# Patient Record
Sex: Female | Born: 1975 | Race: White | Hispanic: No | Marital: Married | Smoking: Never smoker
Health system: Southern US, Community
[De-identification: ages and names within clinical notes are randomized; demographics above are authoritative.]

## PROBLEM LIST (undated history)

## (undated) DIAGNOSIS — O24410 Gestational diabetes mellitus in pregnancy, diet controlled: Secondary | ICD-10-CM

## (undated) DIAGNOSIS — O24419 Gestational diabetes mellitus in pregnancy, unspecified control: Secondary | ICD-10-CM

## (undated) DIAGNOSIS — D62 Acute posthemorrhagic anemia: Secondary | ICD-10-CM

## (undated) DIAGNOSIS — E119 Type 2 diabetes mellitus without complications: Secondary | ICD-10-CM

## (undated) DIAGNOSIS — Z98891 History of uterine scar from previous surgery: Secondary | ICD-10-CM

## (undated) DIAGNOSIS — B999 Unspecified infectious disease: Secondary | ICD-10-CM

## (undated) HISTORY — DX: Type 2 diabetes mellitus without complications: E11.9

---

## 2002-07-21 ENCOUNTER — Other Ambulatory Visit: Admission: RE | Admit: 2002-07-21 | Discharge: 2002-07-21 | Payer: Self-pay | Admitting: Obstetrics and Gynecology

## 2004-06-30 ENCOUNTER — Encounter: Admission: RE | Admit: 2004-06-30 | Discharge: 2004-06-30 | Payer: Self-pay | Admitting: Neurology

## 2004-07-21 ENCOUNTER — Encounter (HOSPITAL_COMMUNITY): Admission: RE | Admit: 2004-07-21 | Discharge: 2004-07-21 | Payer: Self-pay | Admitting: Psychiatry

## 2005-01-09 ENCOUNTER — Encounter: Admission: RE | Admit: 2005-01-09 | Discharge: 2005-01-09 | Payer: Self-pay | Admitting: Neurology

## 2009-06-23 ENCOUNTER — Encounter: Admission: RE | Admit: 2009-06-23 | Discharge: 2009-06-23 | Payer: Self-pay | Admitting: Obstetrics & Gynecology

## 2009-07-29 ENCOUNTER — Encounter: Admission: RE | Admit: 2009-07-29 | Discharge: 2009-07-29 | Payer: Self-pay | Admitting: Obstetrics & Gynecology

## 2010-04-16 ENCOUNTER — Inpatient Hospital Stay (HOSPITAL_COMMUNITY): Admission: AD | Admit: 2010-04-16 | Discharge: 2010-04-16 | Payer: Self-pay | Admitting: Obstetrics and Gynecology

## 2010-04-19 ENCOUNTER — Inpatient Hospital Stay (HOSPITAL_COMMUNITY): Admission: AD | Admit: 2010-04-19 | Discharge: 2010-04-19 | Payer: Self-pay | Admitting: Obstetrics & Gynecology

## 2010-04-20 ENCOUNTER — Inpatient Hospital Stay (HOSPITAL_COMMUNITY): Admission: AD | Admit: 2010-04-20 | Discharge: 2010-04-23 | Payer: Self-pay | Admitting: Obstetrics & Gynecology

## 2010-04-27 ENCOUNTER — Ambulatory Visit: Admission: RE | Admit: 2010-04-27 | Discharge: 2010-04-27 | Payer: Self-pay | Admitting: Obstetrics & Gynecology

## 2010-06-29 ENCOUNTER — Inpatient Hospital Stay (HOSPITAL_COMMUNITY): Admission: AD | Admit: 2010-06-29 | Discharge: 2010-04-20 | Payer: Self-pay | Admitting: Obstetrics

## 2010-08-13 ENCOUNTER — Encounter: Payer: Self-pay | Admitting: Obstetrics & Gynecology

## 2010-10-05 LAB — CBC
HCT: 28.4 % — ABNORMAL LOW (ref 36.0–46.0)
HCT: 36.8 % (ref 36.0–46.0)
Hemoglobin: 10 g/dL — ABNORMAL LOW (ref 12.0–15.0)
Hemoglobin: 12.9 g/dL (ref 12.0–15.0)
MCH: 34.2 pg — ABNORMAL HIGH (ref 26.0–34.0)
MCH: 34.4 pg — ABNORMAL HIGH (ref 26.0–34.0)
MCHC: 35.2 g/dL (ref 30.0–36.0)
MCHC: 35.2 g/dL (ref 30.0–36.0)
MCV: 97 fL (ref 78.0–100.0)
MCV: 97.9 fL (ref 78.0–100.0)
Platelets: 198 10*3/uL (ref 150–400)
Platelets: 228 10*3/uL (ref 150–400)
RBC: 2.9 MIL/uL — ABNORMAL LOW (ref 3.87–5.11)
RBC: 3.79 MIL/uL — ABNORMAL LOW (ref 3.87–5.11)
RDW: 13.7 % (ref 11.5–15.5)
RDW: 13.8 % (ref 11.5–15.5)
WBC: 13.6 10*3/uL — ABNORMAL HIGH (ref 4.0–10.5)
WBC: 20.2 10*3/uL — ABNORMAL HIGH (ref 4.0–10.5)

## 2010-10-05 LAB — RPR: RPR Ser Ql: NONREACTIVE

## 2010-12-08 NOTE — Op Note (Signed)
NAME:  Rhonda Quinn, Rhonda Quinn NO.:  [id number]   MEDICAL RECORD NO.:  [id number]           PATIENT TYPE:   LOCATION:                               FACILITY:  [facility]   PHYSICIAN:  Marlan Palau, M.D.  DATE OF BIRTH:  October 08, 1975   DATE OF PROCEDURE:  07/21/2004  DATE OF DISCHARGE:                                 OPERATIVE REPORT   PROCEDURE PERFORMED:  Lumbar puncture.   INDICATIONS FOR PROCEDURE:  Rhonda Quinn is a 35 year old patient with a  history of paresthesias, numbness of the extremities, some weakness of the  hands, dropping things at times.  The patient is being evaluated for  possible demyelinating disease.  Scanning procedures have been unremarkable  so far.  The patient is brought in for lumbar puncture at this time.   DESCRIPTION OF PROCEDURE:  Lumbar puncture was performed with the patient in  fetal position on the right side.  The low back was cleaned with Betadine  solution and approximately 2 mL of 1% Xylocaine was used as local  anesthetic.  A 20 gauge spinal needle was inserted in the L3-4 interspace  and approximately 18 mL of clear colorless spinal fluid was removed for  testing.  Opening pressure was 160 mmH2O.  Tube #1 was sent for VDRL,  cryptococcal antigen, angiotensin converting enzyme level.  Tube #2 was sent  for oligoclonal banding and IgG albumin ratio.  Tube #3 was sent for cell  differential, glucose, protein.  Tube #4 was sent for Lyme's antibody panel.  Blood work was sent for ANA, rheumatoid factor, sed rate, antiphospholipid  antibody panel, lupus anticoagulant antibody, factor V Leiden.  The patient  tolerated the procedure well.  No complications of the above procedure were  noted.        ___________________________________________  C. Lesia Sago, M.D.    CKW/MEDQ  D:  07/21/2004  T:  07/21/2004  Job:  [id number]

## 2012-03-28 LAB — OB RESULTS CONSOLE HEPATITIS B SURFACE ANTIGEN: Hepatitis B Surface Ag: NEGATIVE

## 2012-03-28 LAB — OB RESULTS CONSOLE ANTIBODY SCREEN: Antibody Screen: NEGATIVE

## 2012-03-28 LAB — OB RESULTS CONSOLE RPR: RPR: NONREACTIVE

## 2012-03-28 LAB — OB RESULTS CONSOLE ABO/RH: RH Type: POSITIVE

## 2012-04-07 LAB — OB RESULTS CONSOLE GC/CHLAMYDIA: Chlamydia: NEGATIVE

## 2012-08-15 ENCOUNTER — Encounter: Payer: BC Managed Care – PPO | Attending: Obstetrics & Gynecology | Admitting: *Deleted

## 2012-08-15 ENCOUNTER — Encounter: Payer: Self-pay | Admitting: *Deleted

## 2012-08-15 VITALS — Ht 65.0 in | Wt 146.3 lb

## 2012-08-15 DIAGNOSIS — O24419 Gestational diabetes mellitus in pregnancy, unspecified control: Secondary | ICD-10-CM

## 2012-08-15 DIAGNOSIS — O99810 Abnormal glucose complicating pregnancy: Secondary | ICD-10-CM | POA: Insufficient documentation

## 2012-08-15 DIAGNOSIS — Z713 Dietary counseling and surveillance: Secondary | ICD-10-CM | POA: Insufficient documentation

## 2012-08-15 NOTE — Progress Notes (Signed)
  Patient was seen on 08/15/12 for Gestational Diabetes self-management class at the [facility]. The following learning objectives were met by the patient during this course:   States the definition of Gestational Diabetes  States why dietary management is important in controlling blood glucose  Describes the effects each nutrient has on blood glucose levels  Demonstrates ability to create a balanced meal plan  Demonstrates carbohydrate counting   States when to check blood glucose levels  Demonstrates proper blood glucose monitoring techniques  States the effect of stress and exercise on blood glucose levels  States the importance of limiting caffeine and abstaining from alcohol and smoking  Blood glucose monitor given:  One Touch Ultra Mini Self Monitoring Kit Lot # [id number] X Exp: 4/15 Blood glucose reading: 62 mg md/dl  Patient instructed to monitor glucose levels: FBS: 60 - <90 2 hour: <120  *Patient received handouts:  Nutrition Diabetes and Pregnancy  Carbohydrate Counting List  Patient will be seen for follow-up as needed.

## 2012-08-15 NOTE — Patient Instructions (Signed)
Goals:  Check glucose levels per MD as instructed  Follow Gestational Diabetes Diet as instructed  Call for follow-up as needed    

## 2012-09-24 LAB — OB RESULTS CONSOLE GBS: GBS: NEGATIVE

## 2012-10-27 ENCOUNTER — Encounter (HOSPITAL_COMMUNITY): Payer: Self-pay | Admitting: *Deleted

## 2012-10-27 ENCOUNTER — Encounter (HOSPITAL_COMMUNITY)
Admission: AD | Disposition: A | Discharge status: Home / Self Care | Payer: Self-pay | Source: Ambulatory Visit | Attending: Obstetrics & Gynecology

## 2012-10-27 ENCOUNTER — Inpatient Hospital Stay (HOSPITAL_COMMUNITY): Payer: BC Managed Care – PPO | Admitting: Anesthesiology

## 2012-10-27 ENCOUNTER — Encounter (HOSPITAL_COMMUNITY): Payer: Self-pay | Admitting: Anesthesiology

## 2012-10-27 ENCOUNTER — Inpatient Hospital Stay (HOSPITAL_COMMUNITY): Admission: RE | Admit: 2012-10-27 | Payer: BC Managed Care – PPO | Source: Ambulatory Visit

## 2012-10-27 ENCOUNTER — Inpatient Hospital Stay (HOSPITAL_COMMUNITY)
Admission: AD | Admit: 2012-10-27 | Discharge: 2012-10-30 | DRG: 370 | Disposition: A | Discharge status: Home / Self Care | Payer: BC Managed Care – PPO | Source: Ambulatory Visit | Attending: Obstetrics & Gynecology | Admitting: Obstetrics & Gynecology

## 2012-10-27 DIAGNOSIS — O34219 Maternal care for unspecified type scar from previous cesarean delivery: Secondary | ICD-10-CM | POA: Diagnosis present

## 2012-10-27 DIAGNOSIS — D62 Acute posthemorrhagic anemia: Secondary | ICD-10-CM

## 2012-10-27 DIAGNOSIS — Z98891 History of uterine scar from previous surgery: Secondary | ICD-10-CM

## 2012-10-27 DIAGNOSIS — O324XX0 Maternal care for high head at term, not applicable or unspecified: Secondary | ICD-10-CM | POA: Diagnosis present

## 2012-10-27 DIAGNOSIS — O24410 Gestational diabetes mellitus in pregnancy, diet controlled: Secondary | ICD-10-CM

## 2012-10-27 DIAGNOSIS — O09529 Supervision of elderly multigravida, unspecified trimester: Secondary | ICD-10-CM | POA: Diagnosis present

## 2012-10-27 DIAGNOSIS — O99814 Abnormal glucose complicating childbirth: Secondary | ICD-10-CM | POA: Diagnosis present

## 2012-10-27 DIAGNOSIS — O9902 Anemia complicating childbirth: Secondary | ICD-10-CM | POA: Diagnosis present

## 2012-10-27 DIAGNOSIS — D500 Iron deficiency anemia secondary to blood loss (chronic): Secondary | ICD-10-CM | POA: Diagnosis present

## 2012-10-27 HISTORY — DX: Gestational diabetes mellitus in pregnancy, diet controlled: O24.410

## 2012-10-27 HISTORY — DX: History of uterine scar from previous surgery: Z98.891

## 2012-10-27 HISTORY — DX: Acute posthemorrhagic anemia: D62

## 2012-10-27 HISTORY — DX: Unspecified infectious disease: B99.9

## 2012-10-27 HISTORY — DX: Gestational diabetes mellitus in pregnancy, unspecified control: O24.419

## 2012-10-27 LAB — CBC
Hemoglobin: 13.3 g/dL (ref 12.0–15.0)
MCH: 31.4 pg (ref 26.0–34.0)
Platelets: 242 10*3/uL (ref 150–400)
RBC: 4.23 MIL/uL (ref 3.87–5.11)

## 2012-10-27 LAB — GLUCOSE, CAPILLARY: Glucose-Capillary: 118 mg/dL — ABNORMAL HIGH (ref 70–99)

## 2012-10-27 LAB — TYPE AND SCREEN
ABO/RH(D): A POS
Antibody Screen: NEGATIVE

## 2012-10-27 LAB — ABO/RH: ABO/RH(D): A POS

## 2012-10-27 SURGERY — Surgical Case
Anesthesia: Epidural | Site: Abdomen | Wound class: Clean Contaminated

## 2012-10-27 MED ORDER — PHENYLEPHRINE 40 MCG/ML (10ML) SYRINGE FOR IV PUSH (FOR BLOOD PRESSURE SUPPORT)
80.0000 ug | PREFILLED_SYRINGE | INTRAVENOUS | Status: DC | PRN
  Filled 2012-10-27: qty 5
  Filled 2012-10-27: qty 2

## 2012-10-27 MED ORDER — CITRIC ACID-SODIUM CITRATE 334-500 MG/5ML PO SOLN
30.0000 mL | ORAL | Status: DC | PRN
  Administered 2012-10-27: 30 mL via ORAL
  Filled 2012-10-27: qty 15

## 2012-10-27 MED ORDER — OXYTOCIN BOLUS FROM INFUSION: 500.0000 mL | INTRAVENOUS | Status: DC

## 2012-10-27 MED ORDER — DIPHENHYDRAMINE HCL 50 MG/ML IJ SOLN: 12.5000 mg | INTRAMUSCULAR | Status: DC | PRN

## 2012-10-27 MED ORDER — LACTATED RINGERS IV SOLN
500.0000 mL | INTRAVENOUS | Status: DC | PRN
  Administered 2012-10-27: 22:00:00 via INTRAVENOUS

## 2012-10-27 MED ORDER — FENTANYL 2.5 MCG/ML BUPIVACAINE 1/10 % EPIDURAL INFUSION (WH - ANES)
14.0000 mL/h | INTRAMUSCULAR | Status: DC | PRN
  Filled 2012-10-27: qty 125

## 2012-10-27 MED ORDER — MORPHINE SULFATE 0.5 MG/ML IJ SOLN
INTRAMUSCULAR | Status: AC
  Filled 2012-10-27: qty 10

## 2012-10-27 MED ORDER — CHLOROPROCAINE HCL 3 % IJ SOLN
INTRAMUSCULAR | Status: AC
  Filled 2012-10-27: qty 20

## 2012-10-27 MED ORDER — PHENYLEPHRINE 40 MCG/ML (10ML) SYRINGE FOR IV PUSH (FOR BLOOD PRESSURE SUPPORT)
PREFILLED_SYRINGE | INTRAVENOUS | Status: AC
  Filled 2012-10-27: qty 5

## 2012-10-27 MED ORDER — ACETAMINOPHEN 325 MG PO TABS: 650.0000 mg | ORAL_TABLET | ORAL | Status: DC | PRN

## 2012-10-27 MED ORDER — KETOROLAC TROMETHAMINE 60 MG/2ML IM SOLN: 60.0000 mg | Freq: Once | INTRAMUSCULAR | Status: AC | PRN

## 2012-10-27 MED ORDER — FENTANYL CITRATE 0.05 MG/ML IJ SOLN
INTRAMUSCULAR | Status: AC
  Filled 2012-10-27: qty 2

## 2012-10-27 MED ORDER — OXYTOCIN 10 UNIT/ML IJ SOLN
40.0000 [IU] | INTRAVENOUS | Status: DC | PRN
  Administered 2012-10-27: 40 [IU] via INTRAVENOUS

## 2012-10-27 MED ORDER — OXYTOCIN 40 UNITS IN LACTATED RINGERS INFUSION - SIMPLE MED
62.5000 mL/h | INTRAVENOUS | Status: DC
  Filled 2012-10-27: qty 1000

## 2012-10-27 MED ORDER — BUPIVACAINE HCL 0.25 % IJ SOLN
INTRAMUSCULAR | Status: DC | PRN
  Administered 2012-10-27: 10 mL

## 2012-10-27 MED ORDER — LACTATED RINGERS IV SOLN
500.0000 mL | Freq: Once | INTRAVENOUS | Status: AC
  Administered 2012-10-27: 1000 mL via INTRAVENOUS

## 2012-10-27 MED ORDER — IBUPROFEN 600 MG PO TABS: 600.0000 mg | ORAL_TABLET | Freq: Four times a day (QID) | ORAL | Status: DC | PRN

## 2012-10-27 MED ORDER — SCOPOLAMINE 1 MG/3DAYS TD PT72
MEDICATED_PATCH | TRANSDERMAL | Status: AC
  Administered 2012-10-27: 1.5 mg via TRANSDERMAL
  Filled 2012-10-27: qty 1

## 2012-10-27 MED ORDER — CEFAZOLIN SODIUM-DEXTROSE 2-3 GM-% IV SOLR
INTRAVENOUS | Status: DC | PRN
  Administered 2012-10-27: 2 g via INTRAVENOUS

## 2012-10-27 MED ORDER — SCOPOLAMINE 1 MG/3DAYS TD PT72: 1.0000 | MEDICATED_PATCH | Freq: Once | TRANSDERMAL | Status: DC

## 2012-10-27 MED ORDER — MORPHINE SULFATE (PF) 0.5 MG/ML IJ SOLN
INTRAMUSCULAR | Status: DC | PRN
  Administered 2012-10-27: 4 mg via EPIDURAL

## 2012-10-27 MED ORDER — LACTATED RINGERS IV SOLN
INTRAVENOUS | Status: DC
  Administered 2012-10-27 (×5): via INTRAVENOUS

## 2012-10-27 MED ORDER — FENTANYL CITRATE 0.05 MG/ML IJ SOLN
INTRAMUSCULAR | Status: DC | PRN
  Administered 2012-10-27 (×2): 50 ug via INTRAVENOUS
  Administered 2012-10-27: 100 ug via EPIDURAL

## 2012-10-27 MED ORDER — BUPIVACAINE HCL (PF) 0.25 % IJ SOLN
INTRAMUSCULAR | Status: AC
  Filled 2012-10-27: qty 30

## 2012-10-27 MED ORDER — CEFAZOLIN SODIUM-DEXTROSE 2-3 GM-% IV SOLR
INTRAVENOUS | Status: AC
  Filled 2012-10-27: qty 50

## 2012-10-27 MED ORDER — MEPERIDINE HCL 25 MG/ML IJ SOLN: 6.2500 mg | INTRAMUSCULAR | Status: DC | PRN

## 2012-10-27 MED ORDER — SODIUM BICARBONATE 8.4 % IV SOLN
INTRAVENOUS | Status: DC | PRN
  Administered 2012-10-27: 5 mL via EPIDURAL

## 2012-10-27 MED ORDER — FENTANYL CITRATE 0.05 MG/ML IJ SOLN: 25.0000 ug | INTRAMUSCULAR | Status: DC | PRN

## 2012-10-27 MED ORDER — EPHEDRINE 5 MG/ML INJ
10.0000 mg | INTRAVENOUS | Status: DC | PRN
  Administered 2012-10-27: 10 mg via INTRAVENOUS
  Filled 2012-10-27: qty 2

## 2012-10-27 MED ORDER — ONDANSETRON HCL 4 MG/2ML IJ SOLN: 4.0000 mg | Freq: Four times a day (QID) | INTRAMUSCULAR | Status: DC | PRN

## 2012-10-27 MED ORDER — EPHEDRINE 5 MG/ML INJ
INTRAVENOUS | Status: AC
  Filled 2012-10-27: qty 10

## 2012-10-27 MED ORDER — LIDOCAINE HCL (PF) 1 % IJ SOLN
INTRAMUSCULAR | Status: DC | PRN
  Administered 2012-10-27 (×2): 8 mL

## 2012-10-27 MED ORDER — OXYCODONE-ACETAMINOPHEN 5-325 MG PO TABS: 1.0000 | ORAL_TABLET | ORAL | Status: DC | PRN

## 2012-10-27 MED ORDER — SODIUM BICARBONATE 8.4 % IV SOLN
INTRAVENOUS | Status: DC | PRN
  Administered 2012-10-27: 20 mL via EPIDURAL

## 2012-10-27 MED ORDER — PHENYLEPHRINE 40 MCG/ML (10ML) SYRINGE FOR IV PUSH (FOR BLOOD PRESSURE SUPPORT)
80.0000 ug | PREFILLED_SYRINGE | INTRAVENOUS | Status: AC | PRN
  Administered 2012-10-27 (×7): 40 ug via INTRAVENOUS
  Administered 2012-10-27 (×2): 80 ug via INTRAVENOUS
  Administered 2012-10-27: 40 ug via INTRAVENOUS
  Administered 2012-10-27: 80 ug via INTRAVENOUS
  Administered 2012-10-27: 40 ug via INTRAVENOUS
  Filled 2012-10-27: qty 2

## 2012-10-27 MED ORDER — LIDOCAINE HCL (PF) 1 % IJ SOLN
30.0000 mL | INTRAMUSCULAR | Status: DC | PRN
  Filled 2012-10-27: qty 30

## 2012-10-27 MED ORDER — ONDANSETRON HCL 4 MG/2ML IJ SOLN
INTRAMUSCULAR | Status: DC | PRN
  Administered 2012-10-27: 4 mg via INTRAVENOUS

## 2012-10-27 MED ORDER — EPHEDRINE 5 MG/ML INJ
10.0000 mg | INTRAVENOUS | Status: DC | PRN
  Filled 2012-10-27: qty 2
  Filled 2012-10-27: qty 4

## 2012-10-27 MED ORDER — MORPHINE SULFATE (PF) 0.5 MG/ML IJ SOLN
INTRAMUSCULAR | Status: DC | PRN
  Administered 2012-10-27: 1 mg via INTRAVENOUS

## 2012-10-27 MED ORDER — FENTANYL 2.5 MCG/ML BUPIVACAINE 1/10 % EPIDURAL INFUSION (WH - ANES)
INTRAMUSCULAR | Status: DC | PRN
  Administered 2012-10-27: 14 mL/h via EPIDURAL

## 2012-10-27 MED ORDER — KETOROLAC TROMETHAMINE 60 MG/2ML IM SOLN
INTRAMUSCULAR | Status: AC
  Administered 2012-10-27: 60 mg via INTRAMUSCULAR
  Filled 2012-10-27: qty 2

## 2012-10-27 SURGICAL SUPPLY — 40 items
BENZOIN TINCTURE PRP APPL 2/3 (GAUZE/BANDAGES/DRESSINGS) ×2 IMPLANT
CLOTH BEACON ORANGE TIMEOUT ST (SAFETY) ×2 IMPLANT
CONTAINER PREFILL 10% NBF 15ML (MISCELLANEOUS) IMPLANT
DRAPE LG THREE QUARTER DISP (DRAPES) ×2 IMPLANT
DRSG OPSITE POSTOP 4X10 (GAUZE/BANDAGES/DRESSINGS) ×2 IMPLANT
DURAPREP 26ML APPLICATOR (WOUND CARE) ×2 IMPLANT
ELECT REM PT RETURN 9FT ADLT (ELECTROSURGICAL) ×2
ELECTRODE REM PT RTRN 9FT ADLT (ELECTROSURGICAL) ×1 IMPLANT
EXTRACTOR VACUUM KIWI (MISCELLANEOUS) IMPLANT
EXTRACTOR VACUUM M CUP 4 TUBE (SUCTIONS) IMPLANT
GLOVE BIO SURGEON STRL SZ7 (GLOVE) ×2 IMPLANT
GLOVE BIOGEL PI IND STRL 7.0 (GLOVE) ×1 IMPLANT
GLOVE BIOGEL PI INDICATOR 7.0 (GLOVE) ×1
GOWN STRL REIN XL XLG (GOWN DISPOSABLE) ×4 IMPLANT
KIT ABG SYR 3ML LUER SLIP (SYRINGE) IMPLANT
NEEDLE HYPO 25X5/8 SAFETYGLIDE (NEEDLE) IMPLANT
NS IRRIG 1000ML POUR BTL (IV SOLUTION) ×2 IMPLANT
PACK C SECTION WH (CUSTOM PROCEDURE TRAY) ×2 IMPLANT
PAD ABD 7.5X8 STRL (GAUZE/BANDAGES/DRESSINGS) ×2 IMPLANT
PAD OB MATERNITY 4.3X12.25 (PERSONAL CARE ITEMS) ×2 IMPLANT
RTRCTR C-SECT PINK 25CM LRG (MISCELLANEOUS) IMPLANT
SLEEVE SCD COMPRESS KNEE MED (MISCELLANEOUS) ×2 IMPLANT
STAPLER VISISTAT 35W (STAPLE) IMPLANT
STRIP CLOSURE SKIN 1/4X4 (GAUZE/BANDAGES/DRESSINGS) ×2 IMPLANT
SUT MNCRL 0 VIOLET CTX 36 (SUTURE) ×3 IMPLANT
SUT MON AB 2-0 SH 27 (SUTURE) ×1
SUT MON AB 2-0 SH27 (SUTURE) ×1 IMPLANT
SUT MONOCRYL 0 CTX 36 (SUTURE) ×3
SUT PLAIN 0 NONE (SUTURE) IMPLANT
SUT PLAIN 2 0 (SUTURE)
SUT PLAIN ABS 2-0 CT1 27XMFL (SUTURE) IMPLANT
SUT VIC AB 0 CT1 27 (SUTURE) ×2
SUT VIC AB 0 CT1 27XBRD ANBCTR (SUTURE) ×2 IMPLANT
SUT VIC AB 2-0 CT1 27 (SUTURE) ×2
SUT VIC AB 2-0 CT1 TAPERPNT 27 (SUTURE) ×2 IMPLANT
SUT VIC AB 4-0 KS 27 (SUTURE) ×2 IMPLANT
SUT VICRYL 0 TIES 12 18 (SUTURE) IMPLANT
TOWEL OR 17X24 6PK STRL BLUE (TOWEL DISPOSABLE) ×6 IMPLANT
TRAY FOLEY CATH 14FR (SET/KITS/TRAYS/PACK) IMPLANT
WATER STERILE IRR 1000ML POUR (IV SOLUTION) IMPLANT

## 2012-10-27 NOTE — MAU Note (Addendum)
Contractions getting closer and stronger, started at 0100.  No bleeding or leaking, has been having mucous d/c. Was 2 in office this morning.  2nd baby, gest diab- diet controlled.

## 2012-10-27 NOTE — Anesthesia Preprocedure Evaluation (Addendum)
Anesthesia Evaluation  Patient identified by MRN, date of birth, ID band Patient awake    Reviewed: Allergy & Precautions, H&P , NPO status , Patient's Chart, lab work & pertinent test results  Airway Mallampati: I TM Distance: >3 FB Neck ROM: full    Dental no notable dental hx.    Pulmonary neg pulmonary ROS,    Pulmonary exam normal       Cardiovascular negative cardio ROS      Neuro/Psych negative neurological ROS  negative psych ROS   GI/Hepatic negative GI ROS, Neg liver ROS,   Endo/Other    Renal/GU negative Renal ROS  negative genitourinary   Musculoskeletal negative musculoskeletal ROS (+)   Abdominal Normal abdominal exam  (+)   Peds negative pediatric ROS (+)  Hematology negative hematology ROS (+)   Anesthesia Other Findings   Reproductive/Obstetrics (+) Pregnancy (failed TOLAC -- for C/S)                          Anesthesia Physical  Anesthesia Plan  ASA: II  Anesthesia Plan: Epidural   Post-op Pain Management:    Induction:   Airway Management Planned:   Additional Equipment:   Intra-op Plan:   Post-operative Plan:   Informed Consent: I have reviewed the patients History and Physical, chart, labs and discussed the procedure including the risks, benefits and alternatives for the proposed anesthesia with the patient or authorized representative who has indicated his/her understanding and acceptance.     Plan Discussed with:   Anesthesia Plan Comments:         Anesthesia Quick Evaluation

## 2012-10-27 NOTE — Progress Notes (Signed)
RAMIA SIDNEY is a 37 y.o. G2P1001 at [redacted]w[redacted]d, trying TOLAC. Feels a lot of rectal pressure. UCs somewhat better since epidural  Objective: BP 105/56  Pulse 91  Temp(Src) 99.8 F (37.7 C) (Oral)  Resp 18  Ht 5\' 5"  (1.651 m)  Wt 150 lb (68.04 kg)  BMI 24.96 kg/m2  SpO2 91%   FHT:  FHR: 140s bpm, variability: moderate,  accelerations:  Present,  decelerations:  Absent UC:  Spontaneous contractions, coupling noted, UCs q 2-3 minutes, some every minute, good restign tone/ relaxed uterus in b/w contractions. No suprapubic tenderness  SVE:   Dilation: 10 Effacement (%): 100 Station: +1 Exam by:: Dr Juliene Pina Complete and same station 0 to +1, LOT since about 3 hrs now. Tried manual rotation, exaggerated Sims.   Labs: Lab Results  Component Value Date   WBC 15.7* 10/27/2012   HGB 13.3 10/27/2012   HCT 38.1 10/27/2012   MCV 90.1 10/27/2012   PLT 242 10/27/2012    Assessment / Plan: Arrest of decent  Labor: Progressing normally and but now arrest of descent and rotation. Fetal Wellbeing:  Category I Pain Control:  Epidural Anticipated MOD:  Guarded for vaginal birth, reassess.   Alysen Smylie R 10/27/2012, 8:41 PM

## 2012-10-27 NOTE — Anesthesia Procedure Notes (Signed)
Epidural Patient location during procedure: OB Start time: 10/27/2012 6:16 PM End time: 10/27/2012 6:20 PM  Staffing Anesthesiologist: Sandrea Hughs Performed by: anesthesiologist   Preanesthetic Checklist Completed: patient identified, site marked, surgical consent, pre-op evaluation, timeout performed, IV checked, risks and benefits discussed and monitors and equipment checked  Epidural Patient position: sitting Prep: site prepped and draped and DuraPrep Patient monitoring: continuous pulse ox and blood pressure Approach: midline Injection technique: LOR air  Needle:  Needle type: Tuohy  Needle gauge: 17 G Needle length: 9 cm and 9 Needle insertion depth: 5 cm cm Catheter type: closed end flexible Catheter size: 19 Gauge Catheter at skin depth: 10 cm Test dose: negative and Other  Assessment Sensory level: T9 Events: blood not aspirated, injection not painful, no injection resistance, negative IV test and no paresthesia  Additional Notes Reason for block:procedure for pain

## 2012-10-27 NOTE — Transfer of Care (Signed)
Immediate Anesthesia Transfer of Care Note  Patient: Rhonda Quinn  Procedure(s) Performed: Procedure(s): CESAREAN SECTION (N/A)  Patient Location: PACU  Anesthesia Type:Epidural  Level of Consciousness: awake  Airway & Oxygen Therapy: Patient Spontanous Breathing  Post-op Assessment: Report given to PACU RN and Post -op Vital signs reviewed and stable  Post vital signs: stable  Complications: No apparent anesthesia complications

## 2012-10-27 NOTE — H&P (Signed)
Rhonda Quinn is a 37 y.o. female G2P1001, at 40.4 wks, presenting in active labor with contractions and bloody show. She has prior one LTCS and desires TOLAC. Denies leaking fluid. Good FMs. Was seen in office today for labor check, was 2 cm dilated. She was scheduled to have repeat C/s on 4/9 with Dr Seymour Bars but is here in labor, desiring vaginal trial after prior c/s x1.   PNCare- [facility]/ Dr Seymour Bars. A1GDM, well controlled. EFW 7.1/2 lbs, PNCare since 1st trimester. Parvo exposure, tests neg. Ultrascreen, Harmony neg, AFP1 nl. 3hr GTT- abn- A1GDM, sono at 37 wks noted EFW at 77%  History OB History   Grav Para Term Preterm Abortions TAB SAB Ect Mult Living   2 1 1  0 0 0 0 0 0 1    Op note mentioned hysterotomy extension to left with laceration of left uterine vessels and O'Leary stitches to control bleeding.   Past Medical History  Diagnosis Date  . Diabetes mellitus without complication   . Gestational diabetes   . Infection     UTI   Past Surgical History  Procedure Laterality Date  . Cesarean section     Family History: family history includes Diabetes in her paternal uncle and Hypertension in her mother. Social History:  reports that she has never smoked. She has never used smokeless tobacco. She reports that she does not drink alcohol or use illicit drugs.  Prenatal Transfer Tool  Maternal Diabetes: Yes:  Diabetes Type:  Diet controlled Genetic Screening: Normal Ultrascreen and Harmony low risk, AFP1 normal Maternal Ultrasounds/Referrals: Normal  Fetal Ultrasounds or other Referrals:  None Maternal Substance Abuse:  No Significant Maternal Medications:  None Significant Maternal Lab Results:  Lab values include: Group B Strep negative Other Comments: Parvovirus exposure in 1st trim, but labs normal, no infection.   Review of Systems  Constitutional: Negative for fever.  Respiratory: Negative for shortness of breath.   Cardiovascular: Negative for chest pain.   Neurological: Negative for headaches.   Exam per MAU RN Dilation: 6 Effacement (%): 80 Station: -2 Blood pressure 135/63, pulse 112, temperature 99.2 F (37.3 C), temperature source Oral, resp. rate 20, height 5\' 5"  (1.651 m), weight 150 lb (68.04 kg).  Exam Physical Exam  A&O x 3,  In pain from labor HEENT neg, no masses Lungs CTA bilat CV RRR, S1S2 normal Abdo soft, non tender, non acute, uterus gravid with contractions and soft/relaxes well in b/w contractions.  Extr no edema/ tenderness Pelvic Completely dilated/ 0 to +1/ LOT FHT  130s/ + accels/ no decels/ moderate variability/ category I Toco spontaneous every 3 min  Prenatal labs: ABO, Rh: --/--/A POS (04/07 1730) Antibody: NEG (04/07 1730) Rubella: Immune (09/06 0000) RPR: Nonreactive (09/06 0000)  HBsAg: Negative (09/06 0000)  HIV: Non-reactive (09/06 0000)  GBS: Negative (03/05 0000)  GLucola- abn glucose tolerance, diet controlled GDM Prenatal screening - Harmony and Ultrascreen neg (low risk), AFP1 nl  Anatomy sono normal   Assessment/Plan: 37 yo, G2P1001, at 40.4 wks, prior term C/s (for failure to descent at complete station after 1 hr of pushing, noted left uterine vessel laceration and repair in Op note) Pt desires vaginal birth trial. Understands risks/complications incl scar dehiscence and emergency c/s, fetal decels/compromise with scar rupture Patient progressed well in active phase, she was 2 cm dilated in office, 6 cm when seen in MAU and by the time arrived to Labor room, got IV (after 3 attempts), she was completely dilated with station at +  2 per RN. I arrived to the room,  Anesthesiologist entered in for epidural, Epidural was deferred and plan was to attempt pushing.  But patient in a lot of pain, station felt more like 0 to -1 and down to +1 with pushing, LOT position, she was given epidural.  Will keep in lateral position, allow rotation and assess for pushing once station down.  FHT category  I   Aedan Geimer R 10/27/2012, 6:41 PM

## 2012-10-27 NOTE — OR Nursing (Signed)
Approximately blood expressed upon uterine massage.   Uterus firm 1 finger below umblicus.

## 2012-10-27 NOTE — Anesthesia Preprocedure Evaluation (Signed)
Anesthesia Evaluation  Patient identified by MRN, date of birth, ID band Patient awake    Reviewed: Allergy & Precautions, H&P , NPO status , Patient's Chart, lab work & pertinent test results  Airway Mallampati: I TM Distance: >3 FB Neck ROM: full    Dental no notable dental hx.    Pulmonary neg pulmonary ROS,    Pulmonary exam normal       Cardiovascular negative cardio ROS      Neuro/Psych negative neurological ROS  negative psych ROS   GI/Hepatic negative GI ROS, Neg liver ROS,   Endo/Other    Renal/GU negative Renal ROS  negative genitourinary   Musculoskeletal negative musculoskeletal ROS (+)   Abdominal Normal abdominal exam  (+)   Peds negative pediatric ROS (+)  Hematology negative hematology ROS (+)   Anesthesia Other Findings   Reproductive/Obstetrics (+) Pregnancy                           Anesthesia Physical Anesthesia Plan  ASA: II  Anesthesia Plan: Epidural   Post-op Pain Management:    Induction:   Airway Management Planned:   Additional Equipment:   Intra-op Plan:   Post-operative Plan:   Informed Consent: I have reviewed the patients History and Physical, chart, labs and discussed the procedure including the risks, benefits and alternatives for the proposed anesthesia with the patient or authorized representative who has indicated his/her understanding and acceptance.     Plan Discussed with:   Anesthesia Plan Comments:         Anesthesia Quick Evaluation  

## 2012-10-27 NOTE — Progress Notes (Signed)
Patient ID: Rhonda Quinn, female   DOB: 09/13/1975, 37 y.o.   MRN: [id number]  Arrest of descent and fetal intolerance to pushign, bloody amniotic fluid with small clots, hematuria noted. Caput, moulding OP.  FHT 150s/ non recurring severe variable decels few down to 60s, rebound fetal tachycardia. Proceed with c-section.   Risks/complications of surgery reviewed incl infection, bleeding, damage to internal organs including bladder, bowels, ureters, blood vessels, other risks from anesthesia, VTE and delayed complications of any surgery, complications in future surgery reviewed. Also discussed neonatal complications incl difficult delivery, laceration, vacuum assistance, TTN etc. Pt understands and agrees, all concerns addressed.

## 2012-10-27 NOTE — Op Note (Signed)
Procedure Note Rhonda Quinn  10/27/2012  Repeat Low Transverse Cesarean Section   Indications: 40.4 wks, active labor with arrest of descent in second stage, OP position and fetal intolerance to pushing, prior cesarean section, patient attempting vaginal trial.    Pre-operative Diagnosis: Arrest of Descent, Fetal Intolerance to Labor, OP.   Post-operative Diagnosis: Same  Surgeon: Robley Fries, MD   Assistants: None  Anesthesia: epidural and local infiltration   Procedure Details:  The patient was seen in the Labor Room. After over 3 hrs of second stage and intermittent pushing effort, baby started to have severe variable decelerations, category II, since it would recover when not pushing. Arrest of descent and persistent OP position despite all maneuvers was diagnosed. Bloody clots noted per vagina, meconium and hematuria noted, hence decision to proceed with repeat cesarean delivery was made with possible concern for prior c/section scar dehiscence but stable patient and baby. Patient agreed.  The risks, benefits, complications, treatment options, and expected outcomes were discussed with the patient. The patient concurred with the proposed plan, giving informed consent. identified as Rhonda Quinn and the procedure verified as C-Section Delivery. She was brought to the Operating Room, a Time Out was held and the above information confirmed.  2 gm Ancef given. After induction of adequate Epidural anesthesia, the patient was draped and prepped in the usual sterile manner. Local 0.25% Marcaine injected at the incision since she was feeling pain.  Then a Pfannenstiel incision was made and carried down through the subcutaneous tissue to the fascia. Fascial incision was made and extended transversely. The fascia was completely adherent to the underlying muscles and separated with sharp dissection. Rectus muscles were separated in midline with sharp dissection as well. e underlying rectus  tissue superiorly and inferiorly. The peritoneum was identified and entered. There was no evidence of hemoperitoneum. Peritoneal incision was extended longitudinally mostly superiorly as bladder was noted adherent to lower segment and lower peritoneum. The utero-vesical peritoneal reflection was incised transversely and the bladder flap was bluntly freed from the lower uterine segment. Bleeding noted from bladder walls, active bleeders were gently cauterized. A low transverse uterine incision was made as high as possible on the lower segment. RN was advised to give fetal head a lift up from the vagina. Delivered from cephalic presentation was a Female infant at 10.11 pm, with Apgar scores of 8 at one minute and 9 at five minutes. Cord ph was sent the umbilical cord was clamped and cut cord blood was obtained for evaluation. The placenta was removed Intact and appeared normal, sent to pathology. Difficult delivery caused bilateral hysterotomy extensions, right side was longer up to bladder reflexion on cervix. Both the tubes and ovaries appeared normal.  First both the extensions were identified and sutured with to have excellent hemostasis. Then the rest of the uterine incision was closed with running locked sutures of 0 Monocryl followed by second imbricating layer. Hemostasis was observed. Lavage was carried out until clear. Peritoneal edges were grasped, no further bleeding noted. Patient was in pain at this point, 10 cc of plain nesacaine was instilled in the peritoneal cavity. Peritoneum closed with 2-0 vicryl. Muscled approximated in midline. Fascia approximated with running sutures of 0Vicryl. The subcuticular closure was performed using 4-0Vicryl. Steristrips and pressure dressing applied.   Instrument, sponge, and needle counts were correct prior the abdominal closure and were correct at the conclusion of the case.   Findings: Baby GIRL, wedged tight, lifted out of mid  pelvis with assistance  by RN from vagina. Delivery was difficult, baby delivered at 10.11 pm. Cord pH sent 7.1 and cord blood and placenta sent. Apgars   Weight pending. Bilateral hysterotomy extensions noted, right longer than left. Sutured well with excellent hemostasis. 3 vessel cord. Normal tubes and ovaries.   Estimated Blood Loss: 1000 ml   Total IV Fluids: 3300 ml LR  Urine Output: 175CC OF bloody urine  But clearing noted at the end of surgery  Specimens: Cord pH (arterial 7.12), cord blood and placenta  Complications: difficult delivery, bilateral hysterotomy extensions  Disposition: PACU - hemodynamically stable.   Maternal Condition: stable   Baby condition / location:  With mother, stable  Attending Attestation: I performed the procedure.   Signed: Surgeon(s): Robley Fries, MD

## 2012-10-28 ENCOUNTER — Encounter (HOSPITAL_COMMUNITY): Payer: Self-pay

## 2012-10-28 DIAGNOSIS — D62 Acute posthemorrhagic anemia: Secondary | ICD-10-CM

## 2012-10-28 DIAGNOSIS — O24410 Gestational diabetes mellitus in pregnancy, diet controlled: Secondary | ICD-10-CM | POA: Diagnosis present

## 2012-10-28 DIAGNOSIS — Z98891 History of uterine scar from previous surgery: Secondary | ICD-10-CM

## 2012-10-28 HISTORY — DX: Acute posthemorrhagic anemia: D62

## 2012-10-28 HISTORY — DX: History of uterine scar from previous surgery: Z98.891

## 2012-10-28 HISTORY — DX: Gestational diabetes mellitus in pregnancy, diet controlled: O24.410

## 2012-10-28 LAB — CBC
HCT: 24.3 % — ABNORMAL LOW (ref 36.0–46.0)
Hemoglobin: 8.8 g/dL — ABNORMAL LOW (ref 12.0–15.0)
Hemoglobin: 8.2 g/dL — ABNORMAL LOW (ref 12.0–15.0)
MCH: 31.1 pg (ref 26.0–34.0)
MCHC: 34.1 g/dL (ref 30.0–36.0)
MCHC: 33.7 g/dL (ref 30.0–36.0)
MCV: 92 fL (ref 78.0–100.0)
Platelets: 172 10*3/uL (ref 150–400)
RBC: 2.84 MIL/uL — ABNORMAL LOW (ref 3.87–5.11)
RBC: 2.64 MIL/uL — ABNORMAL LOW (ref 3.87–5.11)
RDW: 14.2 % (ref 11.5–15.5)
WBC: 20.4 10*3/uL — ABNORMAL HIGH (ref 4.0–10.5)
WBC: 16.9 10*3/uL — ABNORMAL HIGH (ref 4.0–10.5)

## 2012-10-28 LAB — GLUCOSE, CAPILLARY: Glucose-Capillary: 82 mg/dL (ref 70–99)

## 2012-10-28 LAB — RPR: RPR Ser Ql: NONREACTIVE

## 2012-10-28 MED ORDER — NALBUPHINE HCL 10 MG/ML IJ SOLN
5.0000 mg | INTRAMUSCULAR | Status: DC | PRN
  Filled 2012-10-28: qty 1

## 2012-10-28 MED ORDER — ONDANSETRON HCL 4 MG PO TABS: 4.0000 mg | ORAL_TABLET | ORAL | Status: DC | PRN

## 2012-10-28 MED ORDER — CEFAZOLIN SODIUM-DEXTROSE 2-3 GM-% IV SOLR
2.0000 g | INTRAVENOUS | Status: DC
  Filled 2012-10-28: qty 50

## 2012-10-28 MED ORDER — IBUPROFEN 600 MG PO TABS
600.0000 mg | ORAL_TABLET | Freq: Four times a day (QID) | ORAL | Status: DC
  Administered 2012-10-28 – 2012-10-30 (×10): 600 mg via ORAL
  Filled 2012-10-28 (×11): qty 1

## 2012-10-28 MED ORDER — METOCLOPRAMIDE HCL 5 MG/ML IJ SOLN: 10.0000 mg | Freq: Three times a day (TID) | INTRAMUSCULAR | Status: DC | PRN

## 2012-10-28 MED ORDER — ONDANSETRON HCL 4 MG/2ML IJ SOLN: 4.0000 mg | INTRAMUSCULAR | Status: DC | PRN

## 2012-10-28 MED ORDER — SIMETHICONE 80 MG PO CHEW
80.0000 mg | CHEWABLE_TABLET | Freq: Three times a day (TID) | ORAL | Status: DC
  Administered 2012-10-28 – 2012-10-30 (×7): 80 mg via ORAL

## 2012-10-28 MED ORDER — DIPHENHYDRAMINE HCL 50 MG/ML IJ SOLN: 25.0000 mg | INTRAMUSCULAR | Status: DC | PRN

## 2012-10-28 MED ORDER — DIBUCAINE 1 % RE OINT: 1.0000 "application " | TOPICAL_OINTMENT | RECTAL | Status: DC | PRN

## 2012-10-28 MED ORDER — DIPHENHYDRAMINE HCL 25 MG PO CAPS: 25.0000 mg | ORAL_CAPSULE | ORAL | Status: DC | PRN

## 2012-10-28 MED ORDER — WITCH HAZEL-GLYCERIN EX PADS: 1.0000 "application " | MEDICATED_PAD | CUTANEOUS | Status: DC | PRN

## 2012-10-28 MED ORDER — KETOROLAC TROMETHAMINE 30 MG/ML IJ SOLN: 30.0000 mg | Freq: Four times a day (QID) | INTRAMUSCULAR | Status: AC | PRN

## 2012-10-28 MED ORDER — SENNOSIDES-DOCUSATE SODIUM 8.6-50 MG PO TABS
2.0000 | ORAL_TABLET | Freq: Every day | ORAL | Status: DC
  Administered 2012-10-28: 2 via ORAL

## 2012-10-28 MED ORDER — ONDANSETRON HCL 4 MG/2ML IJ SOLN: 4.0000 mg | Freq: Three times a day (TID) | INTRAMUSCULAR | Status: DC | PRN

## 2012-10-28 MED ORDER — OXYCODONE-ACETAMINOPHEN 5-325 MG PO TABS
1.0000 | ORAL_TABLET | ORAL | Status: DC | PRN
  Administered 2012-10-28 – 2012-10-30 (×7): 1 via ORAL
  Filled 2012-10-28 (×8): qty 1

## 2012-10-28 MED ORDER — ZOLPIDEM TARTRATE 5 MG PO TABS: 5.0000 mg | ORAL_TABLET | Freq: Every evening | ORAL | Status: DC | PRN

## 2012-10-28 MED ORDER — DIPHENHYDRAMINE HCL 50 MG/ML IJ SOLN: 12.5000 mg | INTRAMUSCULAR | Status: DC | PRN

## 2012-10-28 MED ORDER — MENTHOL 3 MG MT LOZG: 1.0000 | LOZENGE | OROMUCOSAL | Status: DC | PRN

## 2012-10-28 MED ORDER — OXYTOCIN 40 UNITS IN LACTATED RINGERS INFUSION - SIMPLE MED: 62.5000 mL/h | INTRAVENOUS | Status: AC

## 2012-10-28 MED ORDER — LACTATED RINGERS IV SOLN: INTRAVENOUS | Status: DC

## 2012-10-28 MED ORDER — NALOXONE HCL 1 MG/ML IJ SOLN
1.0000 ug/kg/h | INTRAVENOUS | Status: DC | PRN
  Filled 2012-10-28: qty 2

## 2012-10-28 MED ORDER — LANOLIN HYDROUS EX OINT: 1.0000 "application " | TOPICAL_OINTMENT | CUTANEOUS | Status: DC | PRN

## 2012-10-28 MED ORDER — SODIUM CHLORIDE 0.9 % IJ SOLN: 3.0000 mL | INTRAMUSCULAR | Status: DC | PRN

## 2012-10-28 MED ORDER — LACTATED RINGERS IV SOLN
INTRAVENOUS | Status: DC
  Administered 2012-10-28 (×2): via INTRAVENOUS

## 2012-10-28 MED ORDER — PRENATAL MULTIVITAMIN CH
1.0000 | ORAL_TABLET | Freq: Every day | ORAL | Status: DC
  Administered 2012-10-28 – 2012-10-30 (×2): 1 via ORAL
  Filled 2012-10-28 (×2): qty 1

## 2012-10-28 MED ORDER — NALOXONE HCL 0.4 MG/ML IJ SOLN: 0.4000 mg | INTRAMUSCULAR | Status: DC | PRN

## 2012-10-28 MED ORDER — TETANUS-DIPHTH-ACELL PERTUSSIS 5-2.5-18.5 LF-MCG/0.5 IM SUSP: 0.5000 mL | Freq: Once | INTRAMUSCULAR | Status: DC

## 2012-10-28 MED ORDER — SIMETHICONE 80 MG PO CHEW: 80.0000 mg | CHEWABLE_TABLET | ORAL | Status: DC | PRN

## 2012-10-28 MED ORDER — DIPHENHYDRAMINE HCL 25 MG PO CAPS: 25.0000 mg | ORAL_CAPSULE | Freq: Four times a day (QID) | ORAL | Status: DC | PRN

## 2012-10-28 NOTE — Progress Notes (Signed)
Patient ID: Rhonda Quinn, female   DOB: [birth year], 37 y.o.   MRN: [id number] POD # 1  Subjective: Pt reports feeling well, but tired/ Pain controlled with ibuprofen and IV pain med Tolerating po/ Foley patent with dark amber urine/ No n/v/Flatus neg Denies HA, SOB, dizziness, CP Activity: Has not ambulated or been up since surgery; Up to side of bed and to BR with assist later today Bleeding is light Newborn info:  Information for the patient's newborn:  Rhonda Quinn, Rhonda Quinn [[id number]]  female  Feeding: breast   Objective: VS: Blood pressure 92/53, pulse 93, temperature 98.6 F (37 C), temperature source Oral, resp. rate 18, height 5\' 5"  (1.651 m), weight 68.04 kg (150 lb), SpO2 92.00%, unknown if currently breastfeeding.    Intake/Output Summary (Last 24 hours) at 10/28/12 1038 Last data filed at 10/28/12 0640  Gross per 24 hour  Intake   5580 ml  Output   2575 ml  Net   3005 ml      Recent Labs  10/27/12 1730 10/28/12 0605  WBC 15.7* 20.4*  HGB 13.3 8.8*  HCT 38.1 25.8*  PLT 242 197    Blood type: --/--/A POS, A POS (04/07 1730) Rubella: Immune (09/06 0000)    Physical Exam:  General: alert, cooperative and no distress CV: Regular rate and rhythm Resp: clear Abdomen: soft, nontender, normal bowel sounds Incision: covered with pressure dressing, C/D/Intact Uterine Fundus: firm, below umbilicus, appropriately tender Lochia: minimal Ext: Homans sign is negative, no sign of DVT and no edema, redness or tenderness in the calves or thighs    A/P: POD # 1/ G2P2002 S/P C/Section d/t  Repeat C/S after failed TOLAC Chronic anemia, superimposed with ABL anemia (blood loss 1200cc) Will give additional IV fluids, recheck CBC this pm and in am. Will closely monitor I&O and BP's (BP's historically low during prenatal care, but will watch closely) Doing well Continue routine post op orders   Signed: Demetrius Revel, MSN, [facility] 10/28/2012, 10:38 AM

## 2012-10-28 NOTE — Anesthesia Postprocedure Evaluation (Signed)
  Anesthesia Post-op Note  Anesthesia Post Note  Patient: Rhonda Quinn  Procedure(s) Performed: Procedure(s) (LRB): CESAREAN SECTION (N/A)  Anesthesia type: Epidural  Patient location: PACU  Post pain: Pain level controlled  Post assessment: Post-op Vital signs reviewed  Last Vitals:  Filed Vitals:   10/28/12 0015  BP: 97/52  Pulse: 107  Temp:   Resp: 23    Post vital signs: stable  Level of consciousness: awake  Complications: No apparent anesthesia complications

## 2012-10-28 NOTE — Anesthesia Postprocedure Evaluation (Signed)
  Anesthesia Post-op Note  Patient: Rhonda Quinn  Procedure(s) Performed: Procedure(s) with comments: CESAREAN SECTION REPEAT (N/A) - Repeat C/S  EDD: 10/28/12  Patient Location: Mother/Baby  Anesthesia Type:Epidural  Level of Consciousness: awake  Airway and Oxygen Therapy: Patient Spontanous Breathing  Post-op Pain: none  Post-op Assessment: Patient's Cardiovascular Status Stable, Respiratory Function Stable, Patent Airway, No signs of Nausea or vomiting, Adequate PO intake, Pain level controlled, No headache, No backache, No residual numbness and No residual motor weakness  Post-op Vital Signs: Reviewed and stable  Complications: No apparent anesthesia complications

## 2012-10-29 ENCOUNTER — Inpatient Hospital Stay (HOSPITAL_COMMUNITY)
Admission: AD | Admit: 2012-10-29 | Payer: BC Managed Care – PPO | Source: Ambulatory Visit | Admitting: Obstetrics & Gynecology

## 2012-10-29 LAB — CBC
HCT: 23.5 % — ABNORMAL LOW (ref 36.0–46.0)
Hemoglobin: 7.8 g/dL — ABNORMAL LOW (ref 12.0–15.0)
MCH: 30.8 pg (ref 26.0–34.0)
MCHC: 33.2 g/dL (ref 30.0–36.0)
MCV: 92.9 fL (ref 78.0–100.0)
Platelets: 191 10*3/uL (ref 150–400)
RBC: 2.53 MIL/uL — ABNORMAL LOW (ref 3.87–5.11)
RDW: 14.5 % (ref 11.5–15.5)
WBC: 17 10*3/uL — ABNORMAL HIGH (ref 4.0–10.5)

## 2012-10-29 SURGERY — Surgical Case
Anesthesia: Spinal

## 2012-10-29 MED ORDER — POLYSACCHARIDE IRON COMPLEX 150 MG PO CAPS
150.0000 mg | ORAL_CAPSULE | Freq: Every day | ORAL | Status: DC
  Administered 2012-10-29 – 2012-10-30 (×2): 150 mg via ORAL
  Filled 2012-10-29 (×3): qty 1

## 2012-10-29 MED ORDER — NITROFURANTOIN MONOHYD MACRO 100 MG PO CAPS
100.0000 mg | ORAL_CAPSULE | Freq: Two times a day (BID) | ORAL | Status: DC
  Administered 2012-10-29 – 2012-10-30 (×2): 100 mg via ORAL
  Filled 2012-10-29 (×5): qty 1

## 2012-10-29 MED ORDER — DOCUSATE SODIUM 100 MG PO CAPS
100.0000 mg | ORAL_CAPSULE | Freq: Two times a day (BID) | ORAL | Status: DC
  Administered 2012-10-29 – 2012-10-30 (×3): 100 mg via ORAL
  Filled 2012-10-29 (×3): qty 1

## 2012-10-29 NOTE — Progress Notes (Signed)
POSTOPERATIVE DAY # 2 S/P postoperative  S:         Reports feeling crampy today             Tolerating po intake / no nausea / no vomiting / + flatus / no BM             Bleeding is light             Pain controlled with motrin and percocet             Up ad lib / ambulatory only with direction / unable to void - catheterized x 3 with over 500 ml each episode  Newborn breast feeding     O:  VS: BP 85/39  Pulse 84  Temp(Src) 99 F (37.2 C) (Oral)  Resp 17  Ht 5\' 5"  (1.651 m)  Wt 68.04 kg (150 lb)  BMI 24.96 kg/m2  SpO2 98%  HGB @ initial OB visit 12.5 / @ 28 weeks 9.7 consistent with iron deficiency anemia of pregnancy HGB 13.3 on admission representative of hemoconcentration secondary to NPO status   LABS:  Recent Labs  10/28/12 1600 10/29/12 0605  WBC 16.9* 17.0*  HGB 8.2* 7.8*  PLT 172 191   CBG : postprandial yesterday 188 / FBS this am 82                         I&O: Intake/Output     04/08 0701 - 04/09 0700 04/09 0701 - 04/10 0700   P.O. 960    I.V. (mL/kg)     Other 480    Total Intake(mL/kg) 1440 (21.2)    Urine (mL/kg/hr) 2550 (1.6)    Blood     Total Output 2550     Net -1110                      Physical Exam:             Alert and Oriented X3  Lungs: Clear and unlabored  Heart: regular rate and rhythm / no mumurs  Abdomen: soft, non-tender, non-distended, active BS             Fundus: firm, non-tender, U1+ deviated to right             Dressing intact          Perineum: no edema  Lochia: light  Extremities: no edema, no calf pain or tenderness  A:        POD # 2 S/P repeat cesarean section            GDM - delivered            Iron deficiency anemia compounded by ABL postoperatively            Urinary retention postoperatively  P:        Routine postoperative care              Foley x 8 hours to decompress bladder                                instruction to go to bathroom to void every 2 hours BEFORE urge to void            Macrobid x 3  day course due to recurrent catheterizations in hospitalization  Start iron and colace   Marlinda Mike CNM, MSN 10/29/2012, 8:41 AM

## 2012-10-29 NOTE — Progress Notes (Signed)
Patient up to void was but could not. Fundus deviated to Right. Bladder scanned . I/o Cath 925 ml. Pt tolerated well.

## 2012-10-29 NOTE — Progress Notes (Signed)
Encouraged pt to increase liquid intake and get up and walk to the bathroom every hour to encourage voiding on her own.  Encouraged ambulation.

## 2012-10-29 NOTE — Progress Notes (Signed)
Pt up to bathroom to attempt to void but could not at 2211 assessment. Fundus deviated to the right and patient c/o discomfort. Bladder scanned 535 ml. I/o Cath 800 ml. Patient tolerated it well and has states relief. Will continue to monitor.

## 2012-10-30 ENCOUNTER — Inpatient Hospital Stay (HOSPITAL_COMMUNITY)
Admission: AD | Admit: 2012-10-30 | Discharge: 2012-10-31 | Disposition: A | Discharge status: Home / Self Care | Payer: BC Managed Care – PPO | Source: Ambulatory Visit | Attending: Obstetrics | Admitting: Obstetrics

## 2012-10-30 DIAGNOSIS — N312 Flaccid neuropathic bladder, not elsewhere classified: Secondary | ICD-10-CM | POA: Insufficient documentation

## 2012-10-30 DIAGNOSIS — O99893 Other specified diseases and conditions complicating puerperium: Secondary | ICD-10-CM | POA: Insufficient documentation

## 2012-10-30 DIAGNOSIS — O9081 Anemia of the puerperium: Secondary | ICD-10-CM | POA: Insufficient documentation

## 2012-10-30 DIAGNOSIS — R339 Retention of urine, unspecified: Secondary | ICD-10-CM | POA: Insufficient documentation

## 2012-10-30 DIAGNOSIS — D649 Anemia, unspecified: Secondary | ICD-10-CM | POA: Insufficient documentation

## 2012-10-30 MED ORDER — FERRALET 90 90-1 MG PO TABS: 1.0000 | ORAL_TABLET | Freq: Two times a day (BID) | ORAL | Status: DC

## 2012-10-30 MED ORDER — NITROFURANTOIN MONOHYD MACRO 100 MG PO CAPS: 100.0000 mg | ORAL_CAPSULE | Freq: Two times a day (BID) | ORAL | Status: DC

## 2012-10-30 MED ORDER — DOCUSATE SODIUM 100 MG PO CAPS: 100.0000 mg | ORAL_CAPSULE | Freq: Three times a day (TID) | ORAL | Status: AC | PRN

## 2012-10-30 MED ORDER — IBUPROFEN 600 MG PO TABS: 600.0000 mg | ORAL_TABLET | Freq: Four times a day (QID) | ORAL | Status: DC | PRN

## 2012-10-30 MED ORDER — OXYCODONE-ACETAMINOPHEN 5-325 MG PO TABS: 1.0000 | ORAL_TABLET | ORAL | Status: DC | PRN

## 2012-10-30 NOTE — Progress Notes (Signed)
Bladder scanned patient at 0735 with result of 846 ml. Patient up to attempt to void. AM RN notified.

## 2012-10-30 NOTE — Progress Notes (Signed)
Patient hasn't voided post catheter removal at 1930. Up to void several times with success. Bladder scanned . Patient has been encouraged throughout the night to increase fluids. Notified MD on call. Waiting for a response.

## 2012-10-30 NOTE — Progress Notes (Signed)
Notified Dr. Wonda Olds about patient being unable to void. No new orders given at this time. Will continue to monitor.

## 2012-10-30 NOTE — MAU Note (Signed)
Pt delivered on 10/27/2012 by c-section. Discharged home today. Comes in tonight stating she "thinks the catheter is loose and needs to be reinstalled."

## 2012-10-30 NOTE — Discharge Summary (Signed)
Obstetric Discharge Summary Reason for Admission: G2 P1 0 0 1 @ 40wks 4d in active labor, desiring TOLAC.  Good FM, no leaking of fluid. Prenatal Procedures: ultrasound Intrapartum Procedures: cesarean: low cervical, transverse Postpartum Procedures: multiple in and out caths Complications-Operative and Postpartum: none Hemoglobin  Date Value Range Status  10/29/2012 7.8* 12.0 - 15.0 g/dL Final     HCT  Date Value Range Status  10/29/2012 23.5* 36.0 - 46.0 % Final    Physical Exam:  General: alert, cooperative and no distress Lochia: appropriate Uterine Fundus: firm Incision: well approximated and closed with subcuticular closure.  No erythema and no edema DVT Evaluation: No evidence of DVT seen on physical exam. Negative Homan's sign.  Discharge Diagnoses: S/P Repeat C/S d/t arrest of descent, OP pos, FITL Chronic Anemia, worsened by ABL Anemia Bladder dystonia; unable to void post op; will go home with indwelling foley and return to office on 11/03/12 for voiding trial  Discharge Information: Date: 10/30/2012 Activity: pelvic rest Diet: routine Medications: Ibuprofen, Colace, Iron, Percocet and Macrobid Condition: stable Instructions: refer to practice specific booklet Discharge to: home Follow-up Information   Follow up with LAVOIE,MARIE-LYNE, MD In 6 weeks. (Voiding trial Monday, 11/03/12.  Will call pt with time of appt)    Contact information:   [address] [place] [address] [phone]       Newborn Data: Live born female on 10/27/12 Alan Ripper) Birth Weight: 7 lb 2.6 oz (3250 g) APGAR: 8, 9  Home with mother.  Shelagh Rayman K 10/30/2012, 1:31 PM

## 2012-10-30 NOTE — Progress Notes (Signed)
Patient ID: Rhonda Quinn, female   DOB: 1976-07-17, 37 y.o.   MRN: [id number] POD # 3  Subjective: Pt reports feeling frustrated, sore/ Pain controlled with percocet and ibuprofen Unable to void.  Has had "in and out" caths multiple times, along with indwelling cath.  Bladder fills to [phone] cc's. Denies dysuria, urgency.  macrobid BID initiated prophylactically yesterday. Tolerating po/ No n/v/Flatus pos Activity: out of bed and ambulate Bleeding is light Newborn info:  Information for the patient's newborn:  Mairlyn, [place] Girl Bettyanne [[id number]]  female Feeding: breast   Objective: VS: Blood pressure 105/62, pulse 73, temperature 97.8 F (36.6 C), temperature source Oral, resp. rate 18, height 5\' 5"  (1.651 m), weight 68.04 kg (150 lb), SpO2 99.00%, unknown if currently breastfeeding.   LABS:  Recent Labs  10/28/12 1600 10/29/12 0605  WBC 16.9* 17.0*  HGB 8.2* 7.8*  HCT 24.3* 23.5*  PLT 172 191     Physical Exam:  General: alert, cooperative and no distress CV: Regular rate and rhythm Resp: clear Abdomen: soft, nontender, normal bowel sounds Incision: tegaderm, honeycomb, and steristrips soiled with old dry bloody drainage.  All removed.  Incision is well approximated and closed with sub cu closure, without edema or erythema.  Benzoin and steri strips applied Uterine Fundus: firm, below umbilicus, nontender Lochia: minimal Ext: edema +1 - +2 and Homans sign is negative, no sign of DVT    A/P: POD # 3/ G2P2002/ S/P Repeat C/Section d/t prolonged labor, failed attempt for VBAC Chronic anemia, worsened by ABL Anemia Unable to void.  Will send home with indwelling cath and return to office on 10/03/12 for voiding trial Stable for d/c to home RX's: Ibuprofen 600mg  po Q 6 hrs prn pain #30 Refill x 1 Percocet 5/325 1 - 2 tabs po every 6 hrs prn pain  #30 No refill Ferralet 90 BID #60 Ref x 2 macrobid BID until foley d/c'ed Plan reviewed with Dr Seymour Bars and Dr Juliene Pina.  Dr  Seymour Bars in to see pt today before d/c to home.   Signed: Demetrius Revel, MSN, [facility] 10/30/2012, 8:25 AM

## 2012-10-31 ENCOUNTER — Encounter (HOSPITAL_COMMUNITY): Payer: Self-pay

## 2012-10-31 NOTE — Discharge Summary (Signed)
Reviewed and agree with note and plan. Short interval f/up in office for bladder mngmt. V.Leyani Gargus, MD

## 2012-10-31 NOTE — H&P (Signed)
CC: urinary retention  HPI: post-op from RCS/ failed VBAC. Pt with bladder atony and inability to voided. D/c'd home from delivery earlier tonight. Pt noted tugging on catheter when getting out of car and then became nervous that she pulled out her catheter. Pt called nurse at hospital who told her to come back to have it checked. Pt notes nl post-op pain, no other complaints.  Meds: prophylactic Macrobid  PN issues: desired VBAC, needed c/s. Post-op anemia  PE: Filed Vitals:   10/31/12 0000  BP: 101/49  Pulse: 85  Temp: 97.1 F (36.2 C)  Resp: 18   Gen: well appearing Abd: approp tender, fundus firm at umbilicus Skin: increased tape residue GU: nl vagina/ introitus, foley catheter in place, draining well  A/P: Urinary retention- good foley placement, cont foley until TOV on Monday in office, prophylactic abx - anemia - post-partum.   Lesslie Mossa A. 10/31/2012 12:36 AM

## 2013-05-05 ENCOUNTER — Encounter: Payer: Self-pay | Admitting: Family Medicine

## 2013-05-05 ENCOUNTER — Ambulatory Visit (INDEPENDENT_AMBULATORY_CARE_PROVIDER_SITE_OTHER): Payer: BC Managed Care – PPO | Admitting: Family Medicine

## 2013-05-05 VITALS — BP 120/74 | HR 74 | Temp 98.6°F | Resp 17 | Ht 65.0 in | Wt 136.0 lb

## 2013-05-05 DIAGNOSIS — H109 Unspecified conjunctivitis: Secondary | ICD-10-CM

## 2013-05-05 MED ORDER — TOBRAMYCIN 0.3 % OP SOLN: 1.0000 [drp] | Freq: Four times a day (QID) | OPHTHALMIC | Status: DC

## 2013-05-05 NOTE — Patient Instructions (Signed)

## 2013-05-05 NOTE — Progress Notes (Signed)
  Subjective:  This chart was scribed for [name], MD by Karle Plumber, ED Scribe. This patient was seen in room  and the patient's care was started at 8:45 PM.   Patient ID: Rhonda Quinn, female    DOB: 02-24-1976, 37 y.o.   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 She reports she just starting noticing "goup" in her eyes just a few hours ago and wanted to get them checked before they got too bad.   Review of Systems  Eyes: Positive for discharge and redness.  Endocrine: Negative for polyuria.       Objective:   Physical Exam  Nursing note and vitals reviewed. Constitutional: She is oriented to person, place, and time. She appears well-developed and well-nourished. No distress.  HENT:  Head: Normocephalic and atraumatic.  Mouth/Throat: Oropharynx is clear and moist.  Eyes: Conjunctivae and EOM are normal. Pupils are equal, round, and reactive to light. No scleral icterus.  Injected conjunctivae with exudate along lid margins. Normal fundi normal pupil reaction and EOM.  Neck: Neck supple.  Cardiovascular: Normal rate, regular rhythm, normal heart sounds and intact distal pulses.   No murmur heard. Pulmonary/Chest: Effort normal and breath sounds normal. No stridor. No respiratory distress. She has no rales.  Abdominal: Soft. Bowel sounds are normal. She exhibits no distension. There is no tenderness.  Musculoskeletal: Normal range of motion.  Neurological: She is alert and oriented to person, place, and time.  Skin: Skin is warm and dry. No rash noted.  Psychiatric: She has a normal mood and affect. Her behavior is normal.    Triage Vitals: BP 120/74  Pulse 74  Temp(Src) 98.6 F (37 C) (Oral)  Resp 17  Ht 5\' 5"  (1.651 m)  Wt 136 lb (61.689 kg)  BMI 22.63 kg/m2  SpO2 99%  Breastfeeding? Yes     Assessment & Plan:   Problem List Items Addressed This Visit   None    Visit Diagnoses   Conjunctivitis    -  Primary      DIAGNOSTIC STUDIES: Oxygen Saturation is 99% on RA, normal by my interpretation.   COORDINATION OF CARE: 8:47 PM- Will prescribe antibiotic drops. Pt verbalizes understanding and agrees to plan. Conjunctivitis - Plan: tobramycin (TOBREX) 0.3 % ophthalmic solution  Signed, [name], MD

## 2014-05-24 ENCOUNTER — Encounter: Payer: Self-pay | Admitting: Family Medicine

## 2015-03-07 ENCOUNTER — Other Ambulatory Visit: Payer: Self-pay | Admitting: Surgery

## 2015-03-14 ENCOUNTER — Other Ambulatory Visit: Payer: Self-pay | Admitting: Surgery

## 2015-09-20 ENCOUNTER — Other Ambulatory Visit: Payer: Self-pay

## 2015-09-20 DIAGNOSIS — Z1231 Encounter for screening mammogram for malignant neoplasm of breast: Secondary | ICD-10-CM

## 2016-01-27 ENCOUNTER — Other Ambulatory Visit: Payer: Self-pay | Admitting: Obstetrics & Gynecology

## 2016-01-27 ENCOUNTER — Ambulatory Visit
Admission: RE | Admit: 2016-01-27 | Discharge: 2016-01-27 | Disposition: A | Discharge status: Home / Self Care | Payer: BC Managed Care – PPO | Source: Ambulatory Visit

## 2016-01-27 DIAGNOSIS — Z1231 Encounter for screening mammogram for malignant neoplasm of breast: Secondary | ICD-10-CM

## 2017-01-24 ENCOUNTER — Encounter: Payer: BC Managed Care – PPO | Admitting: Obstetrics & Gynecology

## 2017-01-28 ENCOUNTER — Ambulatory Visit (INDEPENDENT_AMBULATORY_CARE_PROVIDER_SITE_OTHER): Payer: BC Managed Care – PPO | Admitting: Obstetrics & Gynecology

## 2017-01-28 ENCOUNTER — Encounter: Payer: Self-pay | Admitting: Obstetrics & Gynecology

## 2017-01-28 VITALS — BP 128/80 | Ht 65.0 in | Wt 138.0 lb

## 2017-01-28 DIAGNOSIS — Z1151 Encounter for screening for human papillomavirus (HPV): Secondary | ICD-10-CM

## 2017-01-28 DIAGNOSIS — Z3041 Encounter for surveillance of contraceptive pills: Secondary | ICD-10-CM

## 2017-01-28 DIAGNOSIS — Z01419 Encounter for gynecological examination (general) (routine) without abnormal findings: Secondary | ICD-10-CM

## 2017-01-28 MED ORDER — NORETHIN ACE-ETH ESTRAD-FE 1.5-30 MG-MCG PO TABS: 1.0000 | ORAL_TABLET | Freq: Every day | ORAL | 4 refills | Status: DC

## 2017-01-28 NOTE — Patient Instructions (Signed)
1. Encounter for routine gynecological examination with Papanicolaou smear of cervix Normal Gyn exam.  Pap/HPV HR done.  Breasts wnl.  Will schedule screening Mammo.  2. Encounter for surveillance of contraceptive pills Will restart on BCPs with Junel 1.5/30 Fe this time, because had BTB on 1/20.  Usage/risks/benefits previously discussed.   Rhonda Quinn, it was a pleasure to see you today!  I will inform you of your results as soon as available.

## 2017-01-28 NOTE — Addendum Note (Signed)
Addended by: Thurnell Garbe A on: 01/28/2017 04:07 PM   Modules accepted: Orders

## 2017-01-28 NOTE — Progress Notes (Signed)
Rhonda Quinn [facility] Jul 04, 1976 [id number]   History:    41 y.o. G2P2 married.  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  RP:  Established patient presenting for annual gyn exam   HPI: Was on Junel Fe 1/20 with BTB.  Stopped it to observe cycle.  Back to normal with LMP normal on 01/13/2017.  Used condoms.  No pelvic pain.  Breasts wnl.  Mictions/BMs wnl.  Past medical history,surgical history, family history and social history were all reviewed and documented in the EPIC chart.  Gynecologic History Patient's last menstrual period was 01/13/2017. Contraception: condoms Last Pap: 07/2014. Results were: normal Last mammogram: 01/2016. Results were: normal  Obstetric History OB History  Gravida Para Term Preterm AB Living  2 2 2  0 0 2  SAB TAB Ectopic Multiple Live Births  0 0 0 0 2    # Outcome Date GA Lbr Len/2nd Weight Sex Delivery Anes PTL Lv  2 Term 10/27/12 52w4d16:50 / 04:21 3.25 kg (7 lb 2.6 oz) F CS-LTranv EPI  LIV  1 Term 2011 441w4d3.487 kg (7 lb 11 oz) M CS-LTranv EPI N LIV     Birth Comments: got to 9cm and stalled out       ROS: A ROS was performed and pertinent positives and negatives are included in the history.  GENERAL: No fevers or chills. HEENT: No change in vision, no earache, sore throat or sinus congestion. NECK: No pain or stiffness. CARDIOVASCULAR: No chest pain or pressure. No palpitations. PULMONARY: No shortness of breath, cough or wheeze. GASTROINTESTINAL: No abdominal pain, nausea, vomiting or diarrhea, melena or bright red blood per rectum. GENITOURINARY: No urinary frequency, urgency, hesitancy or dysuria. MUSCULOSKELETAL: No joint or muscle pain, no back pain, no recent trauma. DERMATOLOGIC: No rash, no itching, no lesions. ENDOCRINE: No polyuria, polydipsia, no heat or cold intolerance. No recent change in weight. HEMATOLOGICAL: No anemia or easy bruising or bleeding. NEUROLOGIC: No headache, seizures, numbness, tingling or weakness. PSYCHIATRIC: No depression,  no loss of interest in normal activity or change in sleep pattern.     Exam:   BP 128/80   Ht 5' 5"  (1.651 m)   Wt 62.6 kg (138 lb)   LMP 01/13/2017   BMI 22.96 kg/m   Body mass index is 22.96 kg/m.  General appearance : Well developed well nourished female. No acute distress HEENT: Eyes: no retinal hemorrhage or exudates,  Neck supple, trachea midline, no carotid bruits, no thyroidmegaly Lungs: Clear to auscultation, no rhonchi or wheezes, or rib retractions  Heart: Regular rate and rhythm, no murmurs or gallops Breast:Examined in sitting and supine position were symmetrical in appearance, no palpable masses or tenderness,  no skin retraction, no nipple inversion, no nipple discharge, no skin discoloration, no axillary or supraclavicular lymphadenopathy Abdomen: no palpable masses or tenderness, no rebound or guarding Extremities: no edema or skin discoloration or tenderness  Pelvic:  Bartholin, Urethra, Skene Glands: Within normal limits             Vagina: No gross lesions or discharge  Cervix: No gross lesions or discharge.  Pap/HPV done.  Uterus  RV, normal size, shape and consistency, non-tender and mobile  Adnexa  Without masses or tenderness  Anus and perineum  normal     Assessment/Plan:  [age].o. female for annual exam   1. Encounter for routine gynecological examination with Papanicolaou smear of cervix Normal Gyn exam.  Pap/HPV HR done.  Breasts wnl.  Will schedule  screening Mammo.  2. Encounter for surveillance of contraceptive pills Will restart on BCPs with Junel 1.5/30 Fe this time, because had BTB on 1/20.  Usage/risks/benefits previously discussed.  Princess Bruins MD, 3:34 PM 01/28/2017

## 2017-01-29 LAB — PAP, TP IMAGING W/ HPV RNA, RFLX HPV TYPE 16,18/45: HPV MRNA, HIGH RISK: NOT DETECTED

## 2017-02-28 ENCOUNTER — Telehealth: Payer: Self-pay | Admitting: *Deleted

## 2017-02-28 NOTE — Telephone Encounter (Signed)
Pt called c/o spotting with new birth control pills Junel 1.5-30 mg only had medium flow x 3 days, no bleeding now. I informed pt not abnormal to have spotting/ bleeding with switching to new pill, pt will montior next cycle and will start on 2nd pack and follow up if needed.

## 2017-03-12 ENCOUNTER — Other Ambulatory Visit: Payer: Self-pay | Admitting: Obstetrics & Gynecology

## 2017-03-12 DIAGNOSIS — Z1231 Encounter for screening mammogram for malignant neoplasm of breast: Secondary | ICD-10-CM

## 2017-03-29 ENCOUNTER — Ambulatory Visit: Payer: BC Managed Care – PPO

## 2017-04-19 ENCOUNTER — Ambulatory Visit
Admission: RE | Admit: 2017-04-19 | Discharge: 2017-04-19 | Disposition: A | Discharge status: Home / Self Care | Payer: BC Managed Care – PPO | Source: Ambulatory Visit | Attending: Obstetrics & Gynecology | Admitting: Obstetrics & Gynecology

## 2017-04-19 DIAGNOSIS — Z1231 Encounter for screening mammogram for malignant neoplasm of breast: Secondary | ICD-10-CM

## 2018-01-29 ENCOUNTER — Encounter: Payer: BC Managed Care – PPO | Admitting: Obstetrics & Gynecology

## 2018-01-29 ENCOUNTER — Encounter: Payer: Self-pay | Admitting: Obstetrics & Gynecology

## 2018-01-29 ENCOUNTER — Ambulatory Visit: Payer: BC Managed Care – PPO | Admitting: Obstetrics & Gynecology

## 2018-01-29 VITALS — BP 118/78 | Ht 65.0 in | Wt 138.0 lb

## 2018-01-29 DIAGNOSIS — Z3041 Encounter for surveillance of contraceptive pills: Secondary | ICD-10-CM | POA: Diagnosis not present

## 2018-01-29 DIAGNOSIS — Z01419 Encounter for gynecological examination (general) (routine) without abnormal findings: Secondary | ICD-10-CM

## 2018-01-29 MED ORDER — NORETHIN ACE-ETH ESTRAD-FE 1.5-30 MG-MCG PO TABS: 1.0000 | ORAL_TABLET | Freq: Every day | ORAL | 4 refills | Status: DC

## 2018-01-29 NOTE — Progress Notes (Signed)
Rhonda Quinn [facility] 17-Apr-1976 [id number]   History:    42 y.o. 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  RP:  Established patient presenting for annual gyn exam   HPI: Doing much better on Junel Fe 1.5/30.  No BTB.  No pelvic pain.  No pain with IC.  Urine/BMs wnl.  Breasts wnl. BMI 22.96.  Plays tennis.  Health labs with Dr Abner Greenspan.  Past medical history,surgical history, family history and social history were all reviewed and documented in the EPIC chart.  Gynecologic History Patient's last menstrual period was 01/01/2018 (lmp unknown). Contraception: OCP (estrogen/progesterone) Last Pap: 01/2017. Results were: Negative, HPV HR neg Last mammogram: 03/2017. Results were: Negative Bone Density: Never Colonoscopy: Never  Obstetric History OB History  Gravida Para Term Preterm AB Living  2 2 2  0 0 2  SAB TAB Ectopic Multiple Live Births  0 0 0 0 2    # Outcome Date GA Lbr Len/2nd Weight Sex Delivery Anes PTL Lv  2 Term 10/27/12 64w4d16:50 / 04:21 7 lb 2.6 oz (3.25 kg) F CS-LTranv EPI  LIV  1 Term 2011 437w4d7 lb 11 oz (3.487 kg) M CS-LTranv EPI N LIV     Birth Comments: got to 9cm and stalled out     ROS: A ROS was performed and pertinent positives and negatives are included in the history.  GENERAL: No fevers or chills. HEENT: No change in vision, no earache, sore throat or sinus congestion. NECK: No pain or stiffness. CARDIOVASCULAR: No chest pain or pressure. No palpitations. PULMONARY: No shortness of breath, cough or wheeze. GASTROINTESTINAL: No abdominal pain, nausea, vomiting or diarrhea, melena or bright red blood per rectum. GENITOURINARY: No urinary frequency, urgency, hesitancy or dysuria. MUSCULOSKELETAL: No joint or muscle pain, no back pain, no recent trauma. DERMATOLOGIC: No rash, no itching, no lesions. ENDOCRINE: No polyuria, polydipsia, no heat or cold intolerance. No recent change in weight. HEMATOLOGICAL: No anemia or easy  bruising or bleeding. NEUROLOGIC: No headache, seizures, numbness, tingling or weakness. PSYCHIATRIC: No depression, no loss of interest in normal activity or change in sleep pattern.     Exam:   BP 118/78 (BP Location: Right Arm, Patient Position: Sitting, Cuff Size: Normal)   Ht 5' 5"  (1.651 m)   Wt 138 lb (62.6 kg)   LMP 01/01/2018 (LMP Unknown)   Breastfeeding? No   BMI 22.96 kg/m   Body mass index is 22.96 kg/m.  General appearance : Well developed well nourished female. No acute distress HEENT: Eyes: no retinal hemorrhage or exudates,  Neck supple, trachea midline, no carotid bruits, no thyroidmegaly Lungs: Clear to auscultation, no rhonchi or wheezes, or rib retractions  Heart: Regular rate and rhythm, no murmurs or gallops Breast:Examined in sitting and supine position were symmetrical in appearance, no palpable masses or tenderness,  no skin retraction, no nipple inversion, no nipple discharge, no skin discoloration, no axillary or supraclavicular lymphadenopathy Abdomen: no palpable masses or tenderness, no rebound or guarding Extremities: no edema or skin discoloration or tenderness  Pelvic: Vulva: Normal             Vagina: No gross lesions or discharge  Cervix: No gross lesions or discharge  Uterus  AV, normal size, shape and consistency, non-tender and mobile  Adnexa  Without masses or tenderness  Anus: Normal   Assessment/Plan:  [age].o. female for annual exam   1. Well female exam with routine gynecological exam  Normal gynecologic exam.  Last Pap test negative with negative high-risk HPV in July 2018.  Will repeat Pap test next year.  Breast exam normal.  Last mammogram negative in September 2018.  Health labs with Dr. Abner Greenspan family physician.  2. Encounter for surveillance of contraceptive pills Well on Microgestin 1.5/30.  No contraindication to birth control pills.  Birth control pill represcribed.  Other orders - norethindrone-ethinyl estradiol-iron  (JUNEL FE 1.5/30) 1.5-30 MG-MCG tablet; Take 1 tablet by mouth daily.  Princess Bruins MD, 2:59 PM 01/29/2018

## 2018-01-29 NOTE — Patient Instructions (Signed)
1. Well female exam with routine gynecological exam Normal gynecologic exam.  Last Pap test negative with negative high-risk HPV in July 2018.  Will repeat Pap test next year.  Breast exam normal.  Last mammogram negative in September 2018.  Health labs with Dr. Abner Greenspan family physician.  2. Encounter for surveillance of contraceptive pills Well on Microgestin 1.5/30.  No contraindication to birth control pills.  Birth control pill represcribed.  Other orders - norethindrone-ethinyl estradiol-iron (JUNEL FE 1.5/30) 1.5-30 MG-MCG tablet; Take 1 tablet by mouth daily.  Rhonda Quinn, it was a pleasure seeing you today!

## 2018-05-07 ENCOUNTER — Telehealth: Payer: Self-pay

## 2018-05-07 NOTE — Telephone Encounter (Signed)
Patient complained of last pack and current pack of OC's that her period began early in the week before her placebo week. No missed pills. She asked how she could fix this "for easier lifestyle".

## 2018-05-08 ENCOUNTER — Other Ambulatory Visit: Payer: Self-pay | Admitting: Obstetrics & Gynecology

## 2018-05-08 MED ORDER — NORGESTIM-ETH ESTRAD TRIPHASIC 0.18/0.215/0.25 MG-35 MCG PO TABS: 1.0000 | ORAL_TABLET | Freq: Every day | ORAL | 3 refills | Status: DC

## 2018-05-08 NOTE — Telephone Encounter (Signed)
Choices are to observe longer on this BCP or to change, I would suggest Tri-Sprintec.

## 2018-05-08 NOTE — Telephone Encounter (Signed)
Spoke with patient about options. She wants to change OC. Will finish current pack and with new pack will start Tri-Sprintec.

## 2018-10-07 ENCOUNTER — Other Ambulatory Visit: Payer: Self-pay | Admitting: Internal Medicine

## 2018-10-07 DIAGNOSIS — F900 Attention-deficit hyperactivity disorder, predominantly inattentive type: Secondary | ICD-10-CM | POA: Insufficient documentation

## 2018-10-07 MED ORDER — AMPHETAMINE-DEXTROAMPHETAMINE 10 MG PO TABS: 10.0000 mg | ORAL_TABLET | Freq: Two times a day (BID) | ORAL | 0 refills | Status: AC

## 2019-02-02 ENCOUNTER — Ambulatory Visit: Payer: BC Managed Care – PPO | Admitting: Obstetrics & Gynecology

## 2019-02-02 ENCOUNTER — Other Ambulatory Visit: Payer: Self-pay

## 2019-02-02 ENCOUNTER — Encounter: Payer: Self-pay | Admitting: Obstetrics & Gynecology

## 2019-02-02 VITALS — BP 110/80 | Ht 64.5 in | Wt 139.0 lb

## 2019-02-02 DIAGNOSIS — Z3041 Encounter for surveillance of contraceptive pills: Secondary | ICD-10-CM

## 2019-02-02 DIAGNOSIS — Z01419 Encounter for gynecological examination (general) (routine) without abnormal findings: Secondary | ICD-10-CM

## 2019-02-02 MED ORDER — NORGESTIM-ETH ESTRAD TRIPHASIC 0.18/0.215/0.25 MG-35 MCG PO TABS: 1.0000 | ORAL_TABLET | Freq: Every day | ORAL | 4 refills | Status: DC

## 2019-02-02 NOTE — Addendum Note (Signed)
Addended by: Thurnell Garbe A on: 02/02/2019 02:51 PM   Modules accepted: Orders

## 2019-02-02 NOTE — Progress Notes (Signed)
Rhonda Quinn [facility] 11-Oct-1975 [id number]   History:    43 y.o. G2P2L2  Married.  Daughter 24 yo, son 12 yo.  RP:  Established patient presenting for annual gyn exam   HPI: Well on Tri-Sprintec generic.  No breakthrough bleeding.  No pelvic pain.  No pain with intercourse.  Urine and bowel movements normal.  Breasts normal.  Body mass index 23.49.  Good fitness and healthy nutrition.  Health labs with Dr. Joylene Draft.  Past medical history,surgical history, family history and social history were all reviewed and documented in the EPIC chart.  Gynecologic History Patient's last menstrual period was 01/21/2019. Contraception: OCP (estrogen/progesterone) Last Pap: 01/2017. Results were: Negative/HPV HR neg Last mammogram: 03/2017. Results were: Negative Bone Density: Never Colonoscopy: Never  Obstetric History OB History  Gravida Para Term Preterm AB Living  2 2 2  0 0 2  SAB TAB Ectopic Multiple Live Births  0 0 0 0 2    # Outcome Date GA Lbr Len/2nd Weight Sex Delivery Anes PTL Lv  2 Term 10/27/12 68w4d16:50 / 04:21 7 lb 2.6 oz (3.25 kg) F CS-LTranv EPI  LIV  1 Term 2011 434w4d7 lb 11 oz (3.487 kg) M CS-LTranv EPI N LIV     Birth Comments: got to 9cm and stalled out     ROS: A ROS was performed and pertinent positives and negatives are included in the history.  GENERAL: No fevers or chills. HEENT: No change in vision, no earache, sore throat or sinus congestion. NECK: No pain or stiffness. CARDIOVASCULAR: No chest pain or pressure. No palpitations. PULMONARY: No shortness of breath, cough or wheeze. GASTROINTESTINAL: No abdominal pain, nausea, vomiting or diarrhea, melena or bright red blood per rectum. GENITOURINARY: No urinary frequency, urgency, hesitancy or dysuria. MUSCULOSKELETAL: No joint or muscle pain, no back pain, no recent trauma. DERMATOLOGIC: No rash, no itching, no lesions. ENDOCRINE: No polyuria, polydipsia, no heat or cold intolerance. No recent change in weight.  HEMATOLOGICAL: No anemia or easy bruising or bleeding. NEUROLOGIC: No headache, seizures, numbness, tingling or weakness. PSYCHIATRIC: No depression, no loss of interest in normal activity or change in sleep pattern.     Exam:   BP 110/80   Ht 5' 4.5" (1.638 m)   Wt 139 lb (63 kg)   LMP 01/21/2019 Comment: pill  BMI 23.49 kg/m   Body mass index is 23.49 kg/m.  General appearance : Well developed well nourished female. No acute distress HEENT: Eyes: no retinal hemorrhage or exudates,  Neck supple, trachea midline, no carotid bruits, no thyroidmegaly Lungs: Clear to auscultation, no rhonchi or wheezes, or rib retractions  Heart: Regular rate and rhythm, no murmurs or gallops Breast:Examined in sitting and supine position were symmetrical in appearance, no palpable masses or tenderness,  no skin retraction, no nipple inversion, no nipple discharge, no skin discoloration, no axillary or supraclavicular lymphadenopathy Abdomen: no palpable masses or tenderness, no rebound or guarding Extremities: no edema or skin discoloration or tenderness  Pelvic: Vulva: Normal             Vagina: No gross lesions or discharge  Cervix: No gross lesions or discharge.  Pap reflex done.  Uterus  AV, normal size, shape and consistency, non-tender and mobile  Adnexa  Without masses or tenderness  Anus: Normal   Assessment/Plan:  [age].o. female for annual exam   1. Encounter for routine gynecological examination with Papanicolaou smear of cervix Normal gynecologic exam.  Pap reflex done today.  Breast exam normal.  Overdue for screening mammogram, will schedule at the breast center now.  Health labs with Dr. Joylene Draft.  Very good body mass index at 23.49.  Continue with fitness and healthy nutrition.  2. Encounter for surveillance of contraceptive pills Well on the generic of Tri-Sprintec.  No contraindication to continue on birth control pills.  Other orders - Norgestimate-Ethinyl Estradiol Triphasic  (TRI-SPRINTEC) 0.18/0.215/0.25 MG-35 MCG tablet; Take 1 tablet by mouth daily.  Princess Bruins MD, 2:15 PM 02/02/2019

## 2019-02-02 NOTE — Patient Instructions (Signed)
1. Encounter for routine gynecological examination with Papanicolaou smear of cervix Normal gynecologic exam.  Pap reflex done today.  Breast exam normal.  Overdue for screening mammogram, will schedule at the breast center now.  Health labs with Dr. Joylene Draft.  Very good body mass index at 23.49.  Continue with fitness and healthy nutrition.  2. Encounter for surveillance of contraceptive pills Well on the generic of Tri-Sprintec.  No contraindication to continue on birth control pills.  Other orders - Norgestimate-Ethinyl Estradiol Triphasic (TRI-SPRINTEC) 0.18/0.215/0.25 MG-35 MCG tablet; Take 1 tablet by mouth daily.  Allayna, it was a pleasure seeing you today!  I will inform you of your results as soon as they are available.

## 2019-02-03 LAB — PAP IG W/ RFLX HPV ASCU

## 2019-02-06 IMAGING — MG DIGITAL SCREENING BILATERAL MAMMOGRAM WITH CAD
5 series · 5 of 5 positions shown · non-contrast
Comparison: Previous exam(s).

CLINICAL DATA: Screening.

EXAM:
DIGITAL SCREENING BILATERAL MAMMOGRAM WITH CAD

[R MLO]
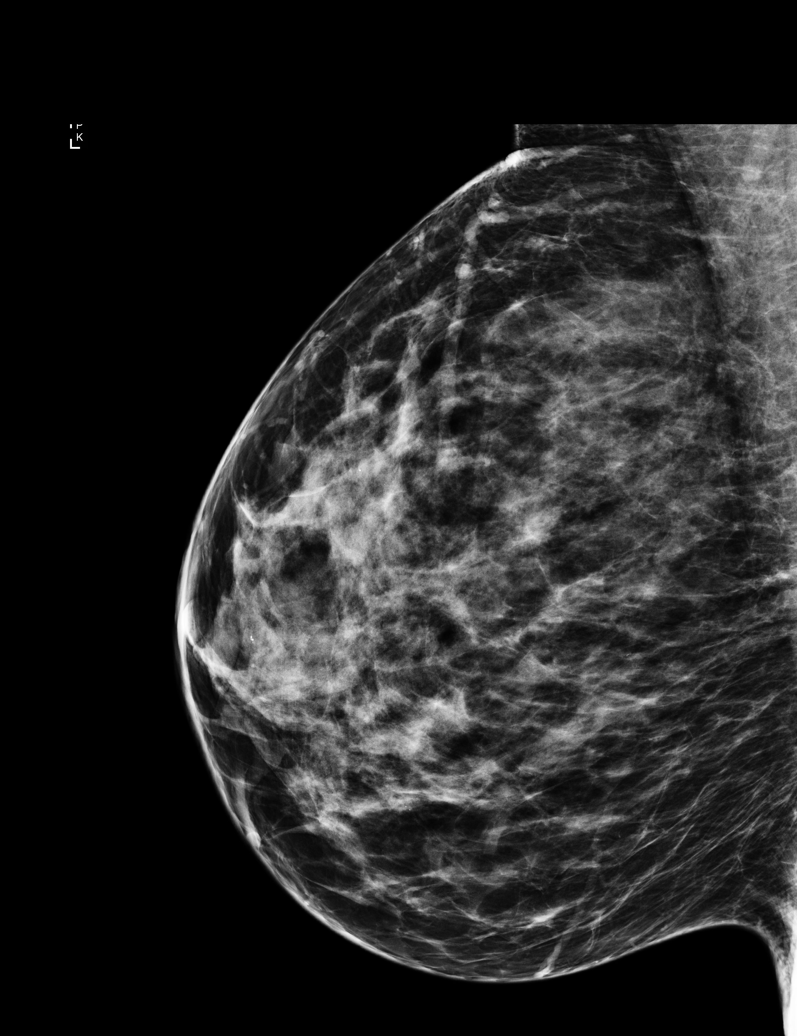

[L CC]
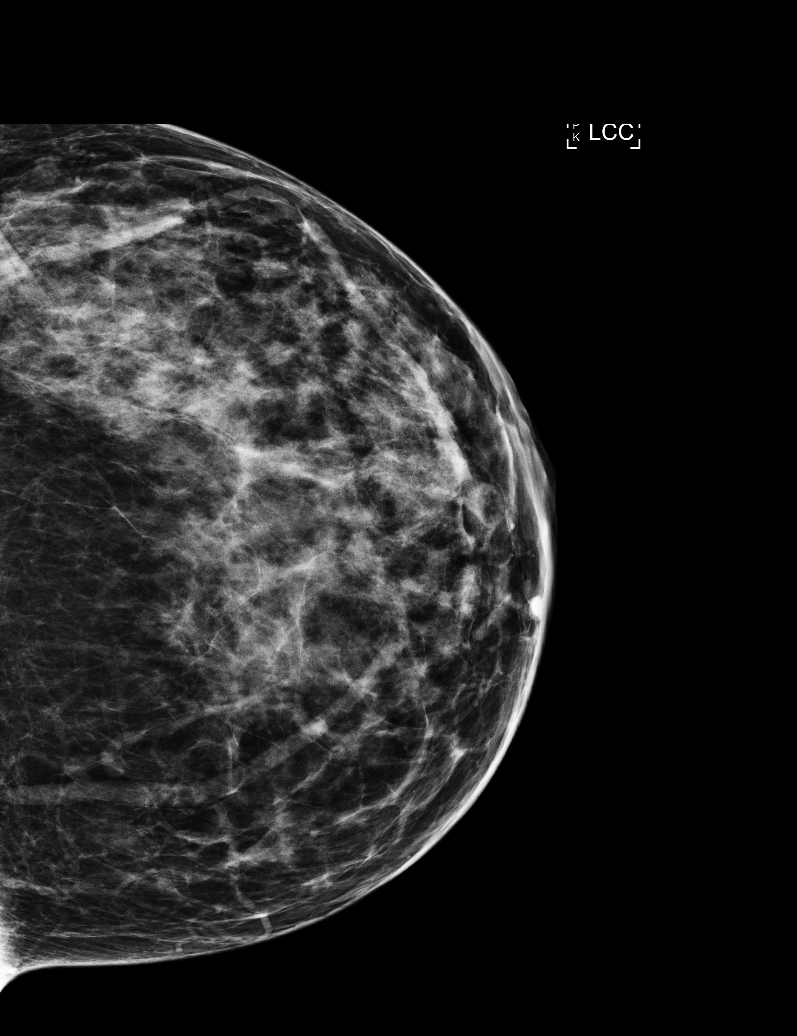

[R CC (1 of 2)]
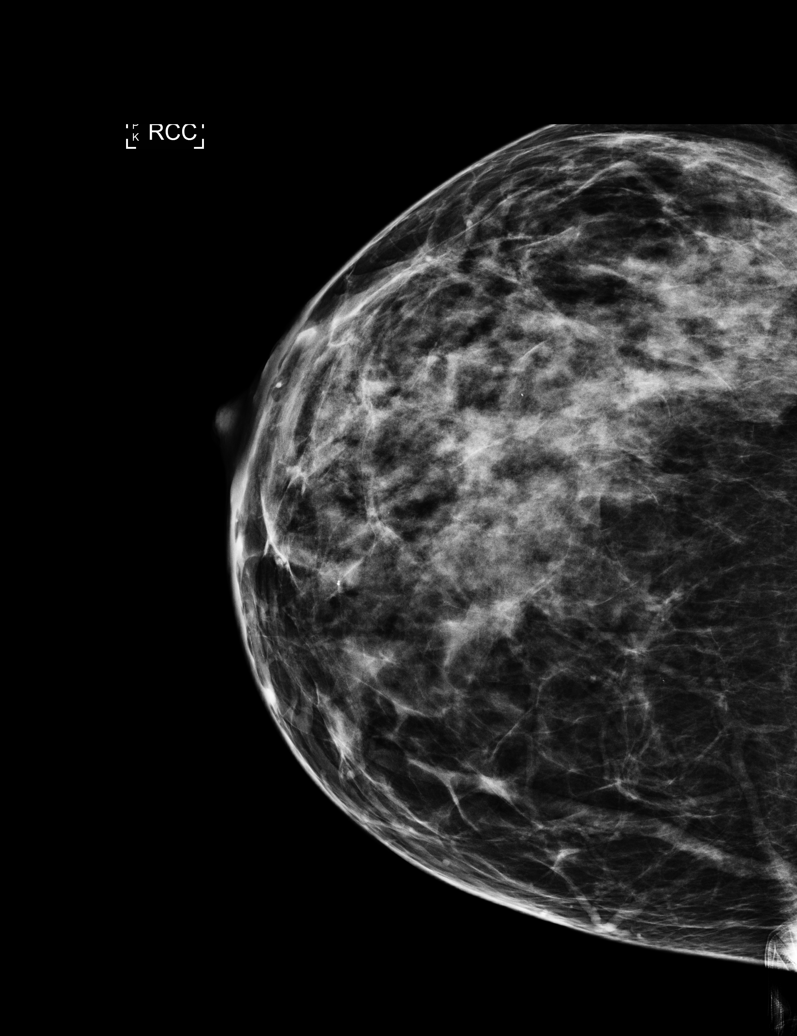

[L MLO]
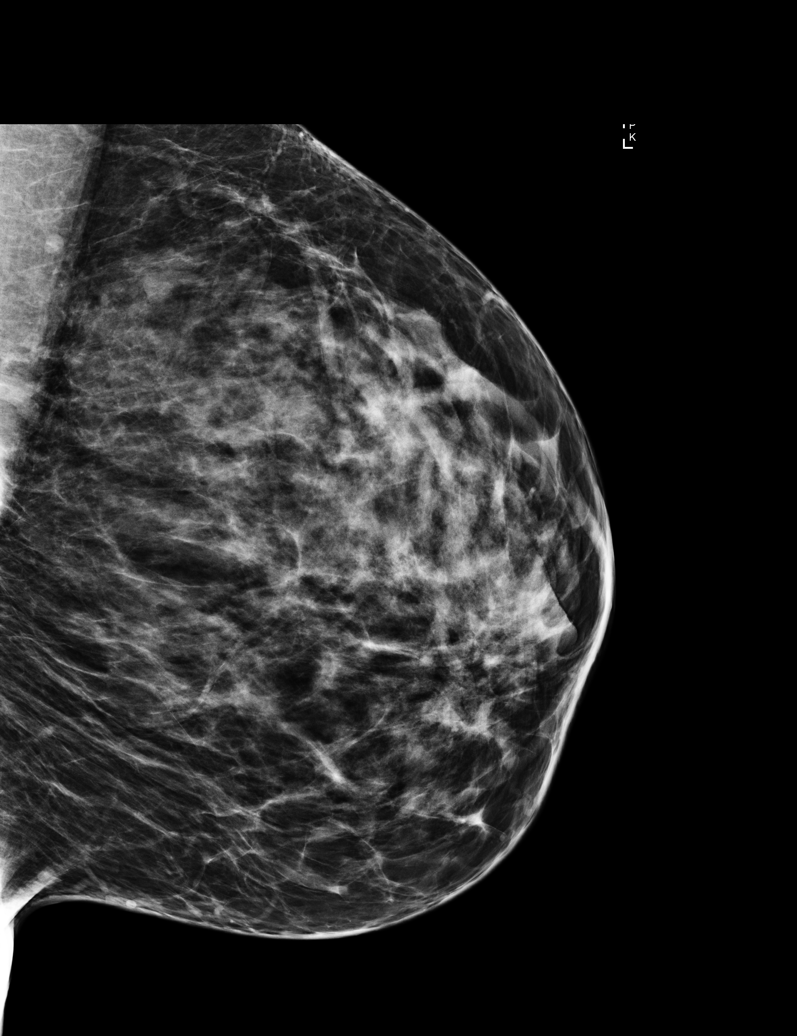

[R CC (2 of 2)]
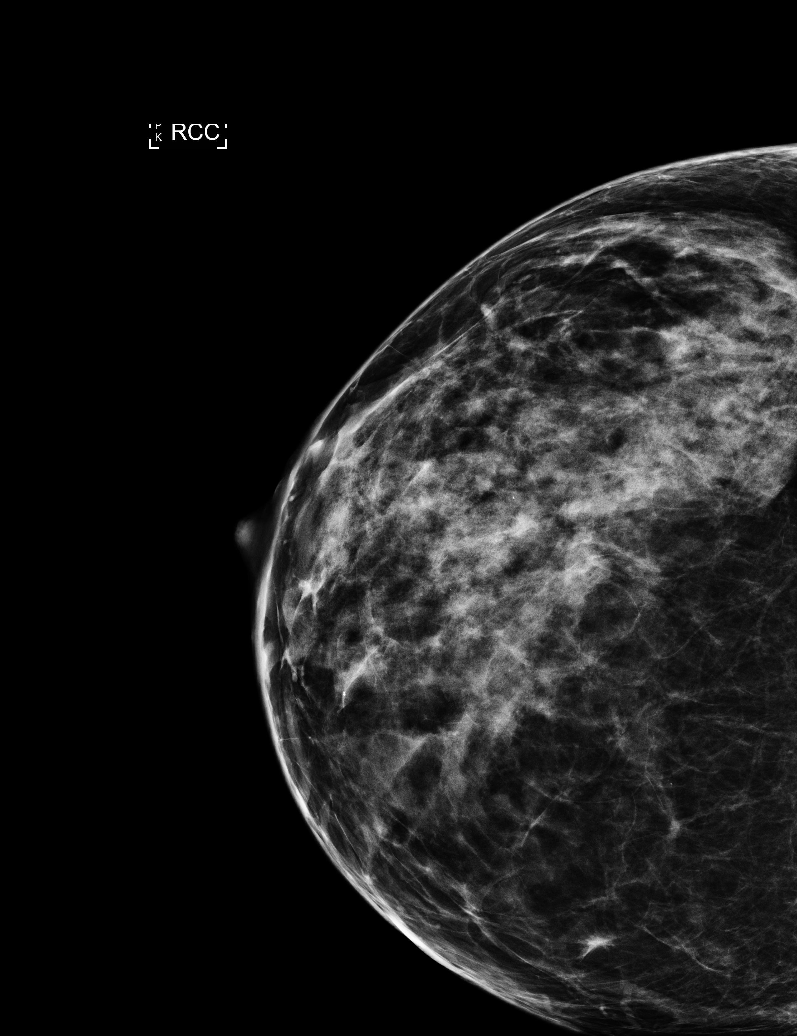

[5 of 5 positions shown; findings below may reference images not displayed]

ACR Breast Density Category c: The breast tissue is heterogeneously
dense, which may obscure small masses.
FINDINGS: There are no findings suspicious for malignancy. Images were
processed with CAD.
IMPRESSION: No mammographic evidence of malignancy. A result letter of this
screening mammogram will be mailed directly to the patient.

RECOMMENDATION:
Screening mammogram in one year. (Code:YJ-[id number])

BI-RADS CATEGORY  1: Negative.

## 2019-04-14 ENCOUNTER — Other Ambulatory Visit: Payer: Self-pay | Admitting: Obstetrics & Gynecology

## 2020-01-05 ENCOUNTER — Encounter: Payer: Self-pay | Admitting: Obstetrics & Gynecology

## 2020-02-03 ENCOUNTER — Other Ambulatory Visit: Payer: Self-pay

## 2020-02-03 ENCOUNTER — Ambulatory Visit: Payer: BC Managed Care – PPO | Admitting: Obstetrics & Gynecology

## 2020-02-03 ENCOUNTER — Encounter: Payer: Self-pay | Admitting: Obstetrics & Gynecology

## 2020-02-03 VITALS — BP 110/78 | Ht 65.0 in | Wt 136.0 lb

## 2020-02-03 DIAGNOSIS — R3915 Urgency of urination: Secondary | ICD-10-CM

## 2020-02-03 DIAGNOSIS — Z3041 Encounter for surveillance of contraceptive pills: Secondary | ICD-10-CM | POA: Diagnosis not present

## 2020-02-03 DIAGNOSIS — Z01419 Encounter for gynecological examination (general) (routine) without abnormal findings: Secondary | ICD-10-CM

## 2020-02-03 MED ORDER — NORGESTIM-ETH ESTRAD TRIPHASIC 0.18/0.215/0.25 MG-35 MCG PO TABS: 1.0000 | ORAL_TABLET | Freq: Every day | ORAL | 4 refills | Status: DC

## 2020-02-03 NOTE — Progress Notes (Signed)
Rhonda Quinn [facility] Jul 26, 1975 [id number]   History:    44 y.o. G2P2L2  Married.  Daughter 8 yo, son 65 yo.  RP:  Established patient presenting for annual gyn exam   HPI: Well on Tri-Sprintec generic.  No breakthrough bleeding.  No pelvic pain.  No pain with intercourse.  Occasional urgency. Bowel movements normal.  Breasts normal.  Mother Breast Ca stage 1. Body mass index 22.63.  Good fitness and healthy nutrition.  Health labs with Dr. Joylene Draft.   Past medical history,surgical history, family history and social history were all reviewed and documented in the EPIC chart.  Gynecologic History Patient's last menstrual period was 01/06/2020. Obstetric History OB History  Gravida Para Term Preterm AB Living  2 2 2  0 0 2  SAB TAB Ectopic Multiple Live Births  0 0 0 0 2    # Outcome Date GA Lbr Len/2nd Weight Sex Delivery Anes PTL Lv  2 Term 10/27/12 [id number]:50 / 04:21 7 lb 2.6 oz (3.25 kg) F CS-LTranv EPI  LIV  1 Term 2011 458w4d7 lb 11 oz (3.487 kg) M CS-LTranv EPI N LIV     Birth Comments: got to 9cm and stalled out     ROS: A ROS was performed and pertinent positives and negatives are included in the history.  GENERAL: No fevers or chills. HEENT: No change in vision, no earache, sore throat or sinus congestion. NECK: No pain or stiffness. CARDIOVASCULAR: No chest pain or pressure. No palpitations. PULMONARY: No shortness of breath, cough or wheeze. GASTROINTESTINAL: No abdominal pain, nausea, vomiting or diarrhea, melena or bright red blood per rectum. GENITOURINARY: No urinary frequency, urgency, hesitancy or dysuria. MUSCULOSKELETAL: No joint or muscle pain, no back pain, no recent trauma. DERMATOLOGIC: No rash, no itching, no lesions. ENDOCRINE: No polyuria, polydipsia, no heat or cold intolerance. No recent change in weight. HEMATOLOGICAL: No anemia or easy bruising or bleeding. NEUROLOGIC: No headache, seizures, numbness, tingling or weakness. PSYCHIATRIC: No depression, no  loss of interest in normal activity or change in sleep pattern.     Exam:   BP 110/78 (BP Location: Right Arm, Patient Position: Sitting, Cuff Size: Normal)   Ht 5' 5"  (1.651 m)   Wt 136 lb (61.7 kg)   LMP 01/06/2020   BMI 22.63 kg/m   Body mass index is 22.63 kg/m.  General appearance : Well developed well nourished female. No acute distress HEENT: Eyes: no retinal hemorrhage or exudates,  Neck supple, trachea midline, no carotid bruits, no thyroidmegaly Lungs: Clear to auscultation, no rhonchi or wheezes, or rib retractions  Heart: Regular rate and rhythm, no murmurs or gallops Breast:Examined in sitting and supine position were symmetrical in appearance, no palpable masses or tenderness,  no skin retraction, no nipple inversion, no nipple discharge, no skin discoloration, no axillary or supraclavicular lymphadenopathy Abdomen: no palpable masses or tenderness, no rebound or guarding Extremities: no edema or skin discoloration or tenderness  Pelvic: Vulva: Normal             Vagina: No gross lesions or discharge  Cervix: No gross lesions or discharge  Uterus AV, normal size, shape and consistency, non-tender and mobile  Adnexa  Without masses or tenderness  Anus: Normal   Assessment/Plan:  [age].o. female for annual exam   1. Well female exam with routine gynecological exam Normal gynecologic exam.  Last Pap test July 2020 was negative, no indication to repeat this year.  Breast exam normal.  Mammogram normal  in 2021 per patient.  Health labs with Dr. Joylene Draft.  2. Encounter for surveillance of contraceptive pills Well on Tri-Sprintec.  No contraindication to continue.  Prescription sent to pharmacy.  3. Urgency of urination Counseling on urgency done.  Recommend to decrease caffeine products.  Increase water intake.  Avoid overfilling of bladder with regular urination every 2-3 hours as needed.  Other orders - FLUoxetine (PROZAC) 10 MG capsule; Take 10 mg by mouth  daily. - Norgestimate-Ethinyl Estradiol Triphasic (TRI-SPRINTEC) 0.18/0.215/0.25 MG-35 MCG tablet; Take 1 tablet by mouth daily.  Princess Bruins MD, 2:14 PM 02/03/2020

## 2020-02-11 ENCOUNTER — Encounter: Payer: Self-pay | Admitting: Obstetrics & Gynecology

## 2020-02-22 ENCOUNTER — Telehealth: Payer: Self-pay | Admitting: *Deleted

## 2020-02-22 NOTE — Telephone Encounter (Signed)
Patient called stating her cycle started at the end of her placebo pills, asked if okay to start into a new pack of pills. She has not missed any pills, taking daily on time, I explained to patient okay to start new pack of pills now even if bleeding from previous pack.

## 2020-03-19 ENCOUNTER — Other Ambulatory Visit: Payer: Self-pay | Admitting: Obstetrics & Gynecology

## 2020-03-22 ENCOUNTER — Telehealth: Payer: Self-pay | Admitting: *Deleted

## 2020-03-22 MED ORDER — NORGESTIM-ETH ESTRAD TRIPHASIC 0.18/0.215/0.25 MG-35 MCG PO TABS: 1.0000 | ORAL_TABLET | Freq: Every day | ORAL | 4 refills | Status: DC

## 2020-03-22 NOTE — Telephone Encounter (Signed)
Patient called requesting a refill on birth control pills,reports [facility] doesn't have refills. Rx sent.

## 2021-02-03 ENCOUNTER — Ambulatory Visit: Payer: BC Managed Care – PPO | Admitting: Obstetrics & Gynecology

## 2021-02-25 ENCOUNTER — Other Ambulatory Visit: Payer: Self-pay | Admitting: Obstetrics & Gynecology

## 2021-02-27 ENCOUNTER — Other Ambulatory Visit: Payer: Self-pay | Admitting: Obstetrics & Gynecology

## 2021-02-27 NOTE — Telephone Encounter (Signed)
Last AEX 02/03/20. Scheduled AEX 03/02/21.

## 2021-03-02 ENCOUNTER — Encounter: Payer: Self-pay | Admitting: Obstetrics & Gynecology

## 2021-03-02 ENCOUNTER — Other Ambulatory Visit: Payer: Self-pay

## 2021-03-02 ENCOUNTER — Ambulatory Visit (INDEPENDENT_AMBULATORY_CARE_PROVIDER_SITE_OTHER): Payer: BC Managed Care – PPO | Admitting: Obstetrics & Gynecology

## 2021-03-02 VITALS — BP 106/70 | HR 78 | Resp 16 | Ht 64.75 in | Wt 146.0 lb

## 2021-03-02 DIAGNOSIS — Z01419 Encounter for gynecological examination (general) (routine) without abnormal findings: Secondary | ICD-10-CM

## 2021-03-02 DIAGNOSIS — Z3041 Encounter for surveillance of contraceptive pills: Secondary | ICD-10-CM | POA: Diagnosis not present

## 2021-03-02 MED ORDER — NORGESTIM-ETH ESTRAD TRIPHASIC 0.18/0.215/0.25 MG-35 MCG PO TABS: 1.0000 | ORAL_TABLET | Freq: Every day | ORAL | 4 refills | Status: DC

## 2021-03-02 NOTE — Progress Notes (Signed)
Rhonda Bruins MD, 1:45 PM 03/02/2021      Cain Sieve [birth year] [id number]   History:    45 y.o. History:    45 y.o. G2P2L2  Married.  Daughter 61 yo, son 65 yo.   RP:  Established patient presenting for annual gyn exam    HPI: Well on Tri-Sprintec generic.  No breakthrough bleeding.  No pelvic pain.  No pain with intercourse.  Occasional urgency. Bowel movements normal.  Breasts normal.  Mother Breast Ca stage 1.  Father Prostate Ca.  Body mass index 24.48.  Good fitness and healthy nutrition.  Health labs with Dr. Joylene Draft.   Past medical history,surgical history, family history and social history were all reviewed and documented in the EPIC chart.  Gynecologic History Patient's last menstrual period was 02/20/2021 (exact date).   Obstetric History OB History  Gravida Para Term Preterm AB Living  2 2 2  0 0 2  SAB IAB Ectopic Multiple Live Births  0 0 0 0 2    # Outcome Date GA Lbr Len/2nd Weight Sex Delivery Anes PTL Lv  2 Term 10/27/12 [id number]:50 / 04:21 7 lb 2.6 oz (3.25 kg) F CS-LTranv EPI  LIV  1 Term 2011 432w4d7 lb 11 oz (3.487 kg) M CS-LTranv EPI N LIV     Birth Comments: got to 9cm and stalled out     ROS: A ROS was performed and pertinent positives and negatives are included in the history.  GENERAL: No fevers or chills. HEENT: No change in vision, no earache, sore throat or sinus congestion. NECK: No pain or stiffness. CARDIOVASCULAR: No chest pain or pressure. No palpitations. PULMONARY: No shortness of breath, cough or wheeze. GASTROINTESTINAL: No abdominal pain, nausea, vomiting or diarrhea, melena or bright red blood per rectum. GENITOURINARY: No urinary frequency, urgency, hesitancy or dysuria. MUSCULOSKELETAL: No joint or muscle pain, no back pain, no recent trauma. DERMATOLOGIC: No rash, no itching, no lesions. ENDOCRINE: No polyuria, polydipsia, no heat or cold intolerance. No recent change in weight. HEMATOLOGICAL: No anemia or easy bruising or  bleeding. NEUROLOGIC: No headache, seizures, numbness, tingling or weakness. PSYCHIATRIC: No depression, no loss of interest in normal activity or change in sleep pattern.     Exam:   BP 106/70   Pulse 78   Resp 16   Ht 5' 4.75" (1.645 m)   Wt 146 lb (66.2 kg)   LMP 02/20/2021 (Exact Date)   BMI 24.48 kg/m   Body mass index is 24.48 kg/m.  General appearance : Well developed well nourished female. No acute distress HEENT: Eyes: no retinal hemorrhage or exudates,  Neck supple, trachea midline, no carotid bruits, no thyroidmegaly Lungs: Clear to auscultation, no rhonchi or wheezes, or rib retractions  Heart: Regular rate and rhythm, no murmurs or gallops Breast:Examined in sitting and supine position were symmetrical in appearance, no palpable masses or tenderness,  no skin retraction, no nipple inversion, no nipple discharge, no skin discoloration, no axillary or supraclavicular lymphadenopathy Abdomen: no palpable masses or tenderness, no rebound or guarding Extremities: no edema or skin discoloration or tenderness  Pelvic: Vulva: Normal             Vagina: No gross lesions or discharge  Cervix: No gross lesions or discharge  Uterus  AV, normal size, shape and consistency, non-tender and mobile  Adnexa  Without masses or tenderness  Anus: Normal   Assessment/Plan:  [age].o. female for annual exam   1. Well female exam with  routine gynecological exam Normal gynecologic exam.  No indication for Pap test this year.  Last Pap test July 2020 was negative.  Breast exam normal.  Screening mammogram June 2022 was negative.  Health labs with Dr. Joylene Draft.  Good body mass index at 24.48.  Continue with fitness and healthy nutrition.  2. Encounter for surveillance of contraceptive pills Norgestimate-Estradiol triphasic.  No contraindication to continue.  Prescription sent to pharmacy.  Other orders - Probiotic Product (PROBIOTIC PO); Take by mouth. - Norgestimate-Ethinyl Estradiol  Triphasic (TRI-ESTARYLLA) 0.18/0.215/0.25 MG-35 MCG tablet; Take 1 tablet by mouth daily.   Rhonda Bruins MD, 1:45 PM 03/02/2021

## 2021-03-05 ENCOUNTER — Encounter: Payer: Self-pay | Admitting: Obstetrics & Gynecology

## 2022-01-19 ENCOUNTER — Encounter: Payer: Self-pay | Admitting: Obstetrics & Gynecology

## 2022-03-06 ENCOUNTER — Ambulatory Visit: Payer: BC Managed Care – PPO | Admitting: Obstetrics & Gynecology

## 2022-03-21 ENCOUNTER — Ambulatory Visit (INDEPENDENT_AMBULATORY_CARE_PROVIDER_SITE_OTHER): Payer: BC Managed Care – PPO | Admitting: Obstetrics & Gynecology

## 2022-03-21 ENCOUNTER — Encounter: Payer: Self-pay | Admitting: Obstetrics & Gynecology

## 2022-03-21 ENCOUNTER — Other Ambulatory Visit (HOSPITAL_COMMUNITY)
Admission: RE | Admit: 2022-03-21 | Discharge: 2022-03-21 | Disposition: A | Discharge status: Home / Self Care | Payer: BC Managed Care – PPO | Source: Ambulatory Visit | Attending: Obstetrics & Gynecology | Admitting: Obstetrics & Gynecology

## 2022-03-21 VITALS — BP 112/68 | Ht 64.5 in | Wt 147.0 lb

## 2022-03-21 DIAGNOSIS — Z01419 Encounter for gynecological examination (general) (routine) without abnormal findings: Secondary | ICD-10-CM | POA: Diagnosis present

## 2022-03-21 DIAGNOSIS — Z3041 Encounter for surveillance of contraceptive pills: Secondary | ICD-10-CM

## 2022-03-21 MED ORDER — NORGESTIM-ETH ESTRAD TRIPHASIC 0.18/0.215/0.25 MG-35 MCG PO TABS: 1.0000 | ORAL_TABLET | Freq: Every day | ORAL | 4 refills | Status: DC

## 2022-03-21 NOTE — Progress Notes (Signed)
Rhonda Quinn [facility] 30-Nov-1975 [id number]   History:    46 y.o. G2P2L2  Married.  Daughter 63 yo, son 47 yo.   RP:  Established patient presenting for annual gyn exam    HPI: Well on Tri-Sprintec generic.  No breakthrough bleeding.  No pelvic pain.  No pain with intercourse.  Pap Neg 01/2019.  Pap reflex today.Occasional urgency. Bowel movements normal.  Breasts normal.  Mammo Neg 12/2021. Mother Breast Ca stage 1.  Father Prostate Ca.  Body mass index 24.84.  Good fitness, playing tennis and pickle ball, and healthy nutrition.  Health labs with Dr. Waynard Edwards.  [facility] 2022.    Past medical history,surgical history, family history and social history were all reviewed and documented in the EPIC chart.  Gynecologic History Patient's last menstrual period was 03/13/2022 (approximate).  Obstetric History OB History  Gravida Para Term Preterm AB Living  2 2 2  0 0 2  SAB IAB Ectopic Multiple Live Births  0 0 0 0 2    # Outcome Date GA Lbr Len/2nd Weight Sex Delivery Anes PTL Lv  2 Term 10/27/12 [redacted]w[redacted]d 16:50 / 04:21 7 lb 2.6 oz (3.25 kg) F CS-LTranv EPI  LIV  1 Term 2011 [redacted]w[redacted]d  7 lb 11 oz (3.487 kg) M CS-LTranv EPI N LIV     Birth Comments: got to 9cm and stalled out     ROS: A ROS was performed and pertinent positives and negatives are included in the history.  GENERAL: No fevers or chills. HEENT: No change in vision, no earache, sore throat or sinus congestion. NECK: No pain or stiffness. CARDIOVASCULAR: No chest pain or pressure. No palpitations. PULMONARY: No shortness of breath, cough or wheeze. GASTROINTESTINAL: No abdominal pain, nausea, vomiting or diarrhea, melena or bright red blood per rectum. GENITOURINARY: No urinary frequency, urgency, hesitancy or dysuria. MUSCULOSKELETAL: No joint or muscle pain, no back pain, no recent trauma. DERMATOLOGIC: No rash, no itching, no lesions. ENDOCRINE: No polyuria, polydipsia, no heat or cold intolerance. No recent change in weight. HEMATOLOGICAL: No  anemia or easy bruising or bleeding. NEUROLOGIC: No headache, seizures, numbness, tingling or weakness. PSYCHIATRIC: No depression, no loss of interest in normal activity or change in sleep pattern.     Exam:   BP 112/68 (BP Location: Left Arm, Patient Position: Sitting, Cuff Size: Normal)   Ht 5' 4.5" (1.638 m)   Wt 147 lb (66.7 kg)   LMP 03/13/2022 (Approximate)   BMI 24.84 kg/m   Body mass index is 24.84 kg/m.  General appearance : Well developed well nourished female. No acute distress HEENT: Eyes: no retinal hemorrhage or exudates,  Neck supple, trachea midline, no carotid bruits, no thyroidmegaly Lungs: Clear to auscultation, no rhonchi or wheezes, or rib retractions  Heart: Regular rate and rhythm, no murmurs or gallops Breast:Examined in sitting and supine position were symmetrical in appearance, no palpable masses or tenderness,  no skin retraction, no nipple inversion, no nipple discharge, no skin discoloration, no axillary or supraclavicular lymphadenopathy Abdomen: no palpable masses or tenderness, no rebound or guarding Extremities: no edema or skin discoloration or tenderness  Pelvic: Vulva: Normal             Vagina: No gross lesions or discharge  Cervix: No gross lesions or discharge.  Pap reflex done.  Uterus  AV, normal size, shape and consistency, non-tender and mobile  Adnexa  Without masses or tenderness  Anus: Normal   Assessment/Plan:  46 y.o. female for annual exam  1. Encounter for routine gynecological examination with Papanicolaou smear of cervix Well on Tri-Sprintec generic.  No breakthrough bleeding.  No pelvic pain.  No pain with intercourse.  Pap Neg 01/2019.  Pap reflex today.Occasional urgency. Bowel movements normal.  Breasts normal.  Mammo Neg 12/2021. Mother Breast Ca stage 1.  Father Prostate Ca.  Body mass index 24.84.  Good fitness and healthy nutrition.  Health labs with Dr. Waynard Edwards.  [place] 2022. - Cytology - PAP( [facility])  2. Encounter  for surveillance of contraceptive pills Well on Tri-Sprintec generic.  No breakthrough bleeding.  No pelvic pain.  No pain with intercourse.  Other orders - amphetamine-dextroamphetamine (ADDERALL XR) 10 MG 24 hr capsule; Take 10 mg by mouth daily. - Norgestimate-Ethinyl Estradiol Triphasic (TRI-ESTARYLLA) 0.18/0.215/0.25 MG-35 MCG tablet; Take 1 tablet by mouth daily.   Genia Del MD, 4:26 PM 03/21/2022

## 2022-03-27 LAB — CYTOLOGY - PAP: Diagnosis: NEGATIVE

## 2022-09-05 ENCOUNTER — Other Ambulatory Visit (HOSPITAL_COMMUNITY): Payer: Self-pay

## 2022-09-05 MED ORDER — HYDROCODONE BIT-HOMATROP MBR 5-1.5 MG/5ML PO SOLN
ORAL | 0 refills | Status: AC
  Filled 2022-09-05: qty 140, 7d supply, fill #0

## 2022-09-05 MED ORDER — LAGEVRIO 200 MG PO CAPS
ORAL_CAPSULE | ORAL | 0 refills | Status: AC
  Filled 2022-09-05: qty 40, 5d supply, fill #0

## 2023-01-28 ENCOUNTER — Encounter: Payer: Self-pay | Admitting: Obstetrics & Gynecology

## 2023-01-31 ENCOUNTER — Other Ambulatory Visit: Payer: Self-pay

## 2023-01-31 MED ORDER — NORGESTIM-ETH ESTRAD TRIPHASIC 0.18/0.215/0.25 MG-35 MCG PO TABS: 1.0000 | ORAL_TABLET | Freq: Every day | ORAL | 4 refills | Status: AC

## 2023-01-31 NOTE — Telephone Encounter (Signed)
Medication refill request: tri-estarylla Last AEX:  03/21/22 Next AEX: 03/26/23 Last MMG (if hormonal medication request): 01/22/23  Refill authorized: #[id number] with 0 rf pended for today. Patient left message stating she is on her last pill.

## 2023-03-26 ENCOUNTER — Ambulatory Visit: Payer: BC Managed Care – PPO | Admitting: Obstetrics & Gynecology
